# Patient Record
Sex: Male | Born: 1955 | Race: White | Hispanic: No | Marital: Married | Smoking: Never smoker
Health system: Southern US, Community
[De-identification: ages and names within clinical notes are randomized; demographics above are authoritative.]

## PROBLEM LIST (undated history)

## (undated) DIAGNOSIS — I38 Endocarditis, valve unspecified: Secondary | ICD-10-CM

## (undated) DIAGNOSIS — K219 Gastro-esophageal reflux disease without esophagitis: Secondary | ICD-10-CM

## (undated) DIAGNOSIS — J189 Pneumonia, unspecified organism: Secondary | ICD-10-CM

## (undated) DIAGNOSIS — Z9889 Other specified postprocedural states: Secondary | ICD-10-CM

## (undated) DIAGNOSIS — I76 Septic arterial embolism: Secondary | ICD-10-CM

## (undated) DIAGNOSIS — Z862 Personal history of diseases of the blood and blood-forming organs and certain disorders involving the immune mechanism: Secondary | ICD-10-CM

## (undated) DIAGNOSIS — I330 Acute and subacute infective endocarditis: Principal | ICD-10-CM

## (undated) DIAGNOSIS — N189 Chronic kidney disease, unspecified: Secondary | ICD-10-CM

## (undated) DIAGNOSIS — R112 Nausea with vomiting, unspecified: Secondary | ICD-10-CM

## (undated) DIAGNOSIS — R7989 Other specified abnormal findings of blood chemistry: Secondary | ICD-10-CM

## (undated) DIAGNOSIS — E7800 Pure hypercholesterolemia, unspecified: Secondary | ICD-10-CM

## (undated) DIAGNOSIS — I2510 Atherosclerotic heart disease of native coronary artery without angina pectoris: Secondary | ICD-10-CM

## (undated) DIAGNOSIS — K760 Fatty (change of) liver, not elsewhere classified: Secondary | ICD-10-CM

## (undated) HISTORY — PX: PERIPHERALLY INSERTED CENTRAL CATHETER INSERTION: SHX2221

## (undated) HISTORY — DX: Endocarditis, valve unspecified: I38

## (undated) HISTORY — PX: PICC LINE REMOVAL (ARMC HX): HXRAD1261

## (undated) HISTORY — PX: COLONOSCOPY: SHX174

## (undated) HISTORY — DX: Atherosclerotic heart disease of native coronary artery without angina pectoris: I25.10

## (undated) HISTORY — DX: Acute and subacute infective endocarditis: I33.0

## (undated) HISTORY — DX: Fatty (change of) liver, not elsewhere classified: K76.0

## (undated) HISTORY — PX: DENTAL SURGERY: SHX609

## (undated) HISTORY — PX: APPENDECTOMY: SHX54

---

## 2014-02-01 DIAGNOSIS — J189 Pneumonia, unspecified organism: Secondary | ICD-10-CM

## 2014-02-01 HISTORY — DX: Pneumonia, unspecified organism: J18.9

## 2014-05-23 ENCOUNTER — Other Ambulatory Visit (HOSPITAL_COMMUNITY): Payer: Self-pay | Admitting: Orthopedic Surgery

## 2014-05-23 ENCOUNTER — Encounter (HOSPITAL_COMMUNITY): Payer: Self-pay | Admitting: *Deleted

## 2014-05-23 MED ORDER — CEFAZOLIN SODIUM-DEXTROSE 2-3 GM-% IV SOLR
2.0000 g | INTRAVENOUS | Status: DC
  Filled 2014-05-23: qty 50

## 2014-05-23 MED ORDER — MUPIROCIN 2 % EX OINT
1.0000 "application " | TOPICAL_OINTMENT | Freq: Once | CUTANEOUS | Status: AC
  Administered 2014-05-24: 1 via TOPICAL
  Filled 2014-05-23: qty 22

## 2014-05-23 NOTE — Progress Notes (Signed)
Spoke with Stephanie to makJudeth Cornfielde MD aware that pt has an allergy to Penicillin. Judeth CornfieldStephanie to call pharmacy with new order.

## 2014-05-23 NOTE — Progress Notes (Signed)
Pt denies SOB, chest pain, and being under the care of a cardiologist. Pt denies having an EKG and chest x ray within the last year. Pt denies having a stress test, echo and cardiac cath. Pt made aware to stop  taking Aspirin, otc vitamins and herbal medications. Do not take any NSAIDs ie: Ibuprofen, Advil, Naproxen or any medication containing Aspirin. Pt verbalized understanding of all pre-op instructions. 

## 2014-05-24 ENCOUNTER — Observation Stay (HOSPITAL_COMMUNITY)
Admission: RE | Admit: 2014-05-24 | Discharge: 2014-05-25 | Disposition: A | Discharge status: Home / Self Care | Payer: BLUE CROSS/BLUE SHIELD | Source: Ambulatory Visit | Attending: Orthopedic Surgery | Admitting: Orthopedic Surgery

## 2014-05-24 ENCOUNTER — Encounter (HOSPITAL_COMMUNITY)
Admission: RE | Disposition: A | Discharge status: Home / Self Care | Payer: Self-pay | Source: Ambulatory Visit | Attending: Orthopedic Surgery

## 2014-05-24 ENCOUNTER — Ambulatory Visit (HOSPITAL_COMMUNITY): Payer: BLUE CROSS/BLUE SHIELD | Admitting: Certified Registered"

## 2014-05-24 ENCOUNTER — Other Ambulatory Visit: Payer: Self-pay

## 2014-05-24 ENCOUNTER — Ambulatory Visit (HOSPITAL_COMMUNITY): Payer: BLUE CROSS/BLUE SHIELD

## 2014-05-24 ENCOUNTER — Encounter (HOSPITAL_COMMUNITY): Payer: Self-pay | Admitting: *Deleted

## 2014-05-24 DIAGNOSIS — S82853A Displaced trimalleolar fracture of unspecified lower leg, initial encounter for closed fracture: Secondary | ICD-10-CM | POA: Diagnosis present

## 2014-05-24 DIAGNOSIS — Z880 Allergy status to penicillin: Secondary | ICD-10-CM | POA: Insufficient documentation

## 2014-05-24 DIAGNOSIS — S82851A Displaced trimalleolar fracture of right lower leg, initial encounter for closed fracture: Secondary | ICD-10-CM | POA: Diagnosis not present

## 2014-05-24 DIAGNOSIS — Y929 Unspecified place or not applicable: Secondary | ICD-10-CM | POA: Insufficient documentation

## 2014-05-24 DIAGNOSIS — Z884 Allergy status to anesthetic agent status: Secondary | ICD-10-CM | POA: Insufficient documentation

## 2014-05-24 DIAGNOSIS — W1830XA Fall on same level, unspecified, initial encounter: Secondary | ICD-10-CM | POA: Insufficient documentation

## 2014-05-24 DIAGNOSIS — E780 Pure hypercholesterolemia: Secondary | ICD-10-CM | POA: Insufficient documentation

## 2014-05-24 DIAGNOSIS — S82841A Displaced bimalleolar fracture of right lower leg, initial encounter for closed fracture: Secondary | ICD-10-CM

## 2014-05-24 HISTORY — DX: Other specified postprocedural states: Z98.890

## 2014-05-24 HISTORY — PX: ORIF ANKLE FRACTURE: SHX5408

## 2014-05-24 HISTORY — DX: Pure hypercholesterolemia, unspecified: E78.00

## 2014-05-24 HISTORY — DX: Nausea with vomiting, unspecified: R11.2

## 2014-05-24 HISTORY — DX: Other specified abnormal findings of blood chemistry: R79.89

## 2014-05-24 LAB — COMPREHENSIVE METABOLIC PANEL WITH GFR
ALT: 20 U/L (ref 0–53)
AST: 21 U/L (ref 0–37)
Albumin: 3.7 g/dL (ref 3.5–5.2)
Alkaline Phosphatase: 42 U/L (ref 39–117)
Anion gap: 12 (ref 5–15)
BUN: 14 mg/dL (ref 6–23)
CO2: 22 mmol/L (ref 19–32)
Calcium: 9 mg/dL (ref 8.4–10.5)
Chloride: 106 mmol/L (ref 96–112)
Creatinine, Ser: 1.53 mg/dL — ABNORMAL HIGH (ref 0.50–1.35)
GFR calc Af Amer: 56 mL/min — ABNORMAL LOW (ref >90)
GFR calc non Af Amer: 48 mL/min — ABNORMAL LOW (ref >90)
Glucose, Bld: 90 mg/dL (ref 70–99)
Potassium: 4.2 mmol/L (ref 3.5–5.1)
Sodium: 140 mmol/L (ref 135–145)
Total Bilirubin: 1 mg/dL (ref 0.3–1.2)
Total Protein: 7.4 g/dL (ref 6.0–8.3)

## 2014-05-24 LAB — PROTIME-INR
INR: 1.08 (ref 0.00–1.49)
Prothrombin Time: 14.2 seconds (ref 11.6–15.2)

## 2014-05-24 LAB — APTT: aPTT: 30 s (ref 24–37)

## 2014-05-24 LAB — SURGICAL PCR SCREEN
MRSA, PCR: NEGATIVE
Staphylococcus aureus: NEGATIVE

## 2014-05-24 SURGERY — OPEN REDUCTION INTERNAL FIXATION (ORIF) ANKLE FRACTURE
Anesthesia: General | Site: Ankle | Laterality: Right

## 2014-05-24 MED ORDER — ACETAMINOPHEN 325 MG PO TABS
650.0000 mg | ORAL_TABLET | Freq: Four times a day (QID) | ORAL | Status: DC | PRN
Start: 2014-05-24 — End: 2014-05-25

## 2014-05-24 MED ORDER — KETOROLAC TROMETHAMINE 30 MG/ML IJ SOLN
INTRAMUSCULAR | Status: AC
  Filled 2014-05-24: qty 1

## 2014-05-24 MED ORDER — MIDAZOLAM HCL 5 MG/ML IJ SOLN: 2.0000 mg | Freq: Once | INTRAMUSCULAR | Status: DC

## 2014-05-24 MED ORDER — ASPIRIN EC 325 MG PO TBEC
325.0000 mg | DELAYED_RELEASE_TABLET | Freq: Every day | ORAL | Status: DC
  Administered 2014-05-24 – 2014-05-25 (×2): 325 mg via ORAL
  Filled 2014-05-24 (×2): qty 1

## 2014-05-24 MED ORDER — METHOCARBAMOL 500 MG PO TABS
500.0000 mg | ORAL_TABLET | Freq: Four times a day (QID) | ORAL | Status: DC | PRN
  Administered 2014-05-24: 500 mg via ORAL
  Filled 2014-05-24: qty 1

## 2014-05-24 MED ORDER — DEXAMETHASONE SODIUM PHOSPHATE 10 MG/ML IJ SOLN
INTRAMUSCULAR | Status: DC | PRN
  Administered 2014-05-24: 10 mg via INTRAVENOUS

## 2014-05-24 MED ORDER — ASPIRIN EC 325 MG PO TBEC: 325.0000 mg | DELAYED_RELEASE_TABLET | Freq: Every day | ORAL | Status: DC

## 2014-05-24 MED ORDER — OXYCODONE HCL 5 MG PO TABS
5.0000 mg | ORAL_TABLET | ORAL | Status: DC | PRN
  Administered 2014-05-24: 10 mg via ORAL
  Administered 2014-05-25: 5 mg via ORAL
  Administered 2014-05-25: 10 mg via ORAL
  Filled 2014-05-24: qty 1
  Filled 2014-05-24 (×2): qty 2

## 2014-05-24 MED ORDER — KETOROLAC TROMETHAMINE 30 MG/ML IJ SOLN
30.0000 mg | Freq: Once | INTRAMUSCULAR | Status: AC | PRN
  Administered 2014-05-24: 30 mg via INTRAVENOUS

## 2014-05-24 MED ORDER — HYDROMORPHONE HCL 1 MG/ML IJ SOLN
1.0000 mg | INTRAMUSCULAR | Status: DC | PRN
  Filled 2014-05-24: qty 1

## 2014-05-24 MED ORDER — LIDOCAINE HCL (CARDIAC) 20 MG/ML IV SOLN
INTRAVENOUS | Status: AC
  Filled 2014-05-24: qty 5

## 2014-05-24 MED ORDER — HYDROMORPHONE HCL 1 MG/ML IJ SOLN
INTRAMUSCULAR | Status: AC
  Filled 2014-05-24: qty 1

## 2014-05-24 MED ORDER — FENTANYL CITRATE (PF) 100 MCG/2ML IJ SOLN
INTRAMUSCULAR | Status: DC | PRN
  Administered 2014-05-24: 50 ug via INTRAVENOUS

## 2014-05-24 MED ORDER — METHOCARBAMOL 1000 MG/10ML IJ SOLN
500.0000 mg | Freq: Four times a day (QID) | INTRAVENOUS | Status: DC | PRN
  Filled 2014-05-24: qty 5

## 2014-05-24 MED ORDER — CLINDAMYCIN PHOSPHATE 900 MG/50ML IV SOLN
900.0000 mg | Freq: Once | INTRAVENOUS | Status: AC
  Administered 2014-05-24: 900 mg via INTRAVENOUS

## 2014-05-24 MED ORDER — LACTATED RINGERS IV SOLN
INTRAVENOUS | Status: DC
  Administered 2014-05-24: 11:00:00 via INTRAVENOUS

## 2014-05-24 MED ORDER — ONDANSETRON HCL 4 MG/2ML IJ SOLN
INTRAMUSCULAR | Status: AC
  Filled 2014-05-24: qty 2

## 2014-05-24 MED ORDER — OXYCODONE HCL 5 MG/5ML PO SOLN: 5.0000 mg | Freq: Once | ORAL | Status: DC | PRN

## 2014-05-24 MED ORDER — DEXAMETHASONE SODIUM PHOSPHATE 10 MG/ML IJ SOLN
INTRAMUSCULAR | Status: AC
  Filled 2014-05-24: qty 1

## 2014-05-24 MED ORDER — MIDAZOLAM HCL 2 MG/2ML IJ SOLN
INTRAMUSCULAR | Status: AC
  Filled 2014-05-24: qty 2

## 2014-05-24 MED ORDER — ONDANSETRON HCL 4 MG PO TABS: 4.0000 mg | ORAL_TABLET | Freq: Four times a day (QID) | ORAL | Status: DC | PRN

## 2014-05-24 MED ORDER — FENTANYL CITRATE (PF) 250 MCG/5ML IJ SOLN
INTRAMUSCULAR | Status: AC
  Filled 2014-05-24: qty 5

## 2014-05-24 MED ORDER — METOCLOPRAMIDE HCL 5 MG/ML IJ SOLN: 5.0000 mg | Freq: Three times a day (TID) | INTRAMUSCULAR | Status: DC | PRN

## 2014-05-24 MED ORDER — HYDROMORPHONE HCL 1 MG/ML IJ SOLN
0.2500 mg | INTRAMUSCULAR | Status: DC | PRN
  Administered 2014-05-24 (×2): 0.5 mg via INTRAVENOUS

## 2014-05-24 MED ORDER — MIDAZOLAM HCL 5 MG/5ML IJ SOLN
INTRAMUSCULAR | Status: DC | PRN
  Administered 2014-05-24: 2 mg via INTRAVENOUS

## 2014-05-24 MED ORDER — OXYCODONE HCL 5 MG PO TABS: 5.0000 mg | ORAL_TABLET | Freq: Once | ORAL | Status: DC | PRN

## 2014-05-24 MED ORDER — PROPOFOL 10 MG/ML IV BOLUS
INTRAVENOUS | Status: DC | PRN
  Administered 2014-05-24: 160 mg via INTRAVENOUS

## 2014-05-24 MED ORDER — METOCLOPRAMIDE HCL 5 MG PO TABS: 5.0000 mg | ORAL_TABLET | Freq: Three times a day (TID) | ORAL | Status: DC | PRN

## 2014-05-24 MED ORDER — FENTANYL CITRATE (PF) 100 MCG/2ML IJ SOLN
INTRAMUSCULAR | Status: AC
  Filled 2014-05-24: qty 2

## 2014-05-24 MED ORDER — CLINDAMYCIN PHOSPHATE 900 MG/50ML IV SOLN
INTRAVENOUS | Status: AC
  Filled 2014-05-24: qty 50

## 2014-05-24 MED ORDER — FENTANYL CITRATE (PF) 100 MCG/2ML IJ SOLN: 100.0000 ug | Freq: Once | INTRAMUSCULAR | Status: DC

## 2014-05-24 MED ORDER — PROPOFOL 10 MG/ML IV BOLUS
INTRAVENOUS | Status: AC
  Filled 2014-05-24: qty 20

## 2014-05-24 MED ORDER — ACETAMINOPHEN 650 MG RE SUPP: 650.0000 mg | Freq: Four times a day (QID) | RECTAL | Status: DC | PRN

## 2014-05-24 MED ORDER — CLINDAMYCIN PHOSPHATE 600 MG/50ML IV SOLN
600.0000 mg | Freq: Four times a day (QID) | INTRAVENOUS | Status: AC
  Administered 2014-05-24 – 2014-05-25 (×3): 600 mg via INTRAVENOUS
  Filled 2014-05-24 (×3): qty 50

## 2014-05-24 MED ORDER — ONDANSETRON HCL 4 MG/2ML IJ SOLN
INTRAMUSCULAR | Status: DC | PRN
Start: 2014-05-24 — End: 2014-05-24
  Administered 2014-05-24: 4 mg via INTRAVENOUS

## 2014-05-24 MED ORDER — LIDOCAINE HCL (CARDIAC) 20 MG/ML IV SOLN
INTRAVENOUS | Status: DC | PRN
  Administered 2014-05-24: 60 mg via INTRAVENOUS

## 2014-05-24 MED ORDER — SODIUM CHLORIDE 0.9 % IV SOLN
INTRAVENOUS | Status: DC
  Administered 2014-05-24: 17:00:00 via INTRAVENOUS

## 2014-05-24 MED ORDER — ONDANSETRON HCL 4 MG/2ML IJ SOLN: 4.0000 mg | Freq: Once | INTRAMUSCULAR | Status: DC | PRN

## 2014-05-24 MED ORDER — ONDANSETRON HCL 4 MG/2ML IJ SOLN
4.0000 mg | Freq: Four times a day (QID) | INTRAMUSCULAR | Status: DC | PRN
  Administered 2014-05-24: 4 mg via INTRAVENOUS
  Filled 2014-05-24: qty 2

## 2014-05-24 MED ORDER — 0.9 % SODIUM CHLORIDE (POUR BTL) OPTIME
TOPICAL | Status: DC | PRN
  Administered 2014-05-24: 1000 mL

## 2014-05-24 SURGICAL SUPPLY — 59 items
BANDAGE ESMARK 6X9 LF (GAUZE/BANDAGES/DRESSINGS) IMPLANT
BIT DRILL 2.5X110 QC LCP DISP (BIT) ×2 IMPLANT
BIT DRILL CANN 2.7X625 NONSTRL (BIT) ×2 IMPLANT
BNDG COHESIVE 4X5 TAN STRL (GAUZE/BANDAGES/DRESSINGS) ×2 IMPLANT
BNDG ESMARK 6X9 LF (GAUZE/BANDAGES/DRESSINGS)
BNDG GAUZE ELAST 4 BULKY (GAUZE/BANDAGES/DRESSINGS) ×2 IMPLANT
COVER SURGICAL LIGHT HANDLE (MISCELLANEOUS) ×2 IMPLANT
CUFF TOURNIQUET SINGLE 34IN LL (TOURNIQUET CUFF) IMPLANT
CUFF TOURNIQUET SINGLE 44IN (TOURNIQUET CUFF) IMPLANT
DRAPE INCISE IOBAN 66X45 STRL (DRAPES) ×2 IMPLANT
DRAPE OEC MINIVIEW 54X84 (DRAPES) ×2 IMPLANT
DRAPE PROXIMA HALF (DRAPES) ×2 IMPLANT
DRAPE U-SHAPE 47X51 STRL (DRAPES) ×2 IMPLANT
DRSG ADAPTIC 3X8 NADH LF (GAUZE/BANDAGES/DRESSINGS) ×2 IMPLANT
DRSG PAD ABDOMINAL 8X10 ST (GAUZE/BANDAGES/DRESSINGS) IMPLANT
DURAPREP 26ML APPLICATOR (WOUND CARE) ×2 IMPLANT
ELECT REM PT RETURN 9FT ADLT (ELECTROSURGICAL) ×2
ELECTRODE REM PT RTRN 9FT ADLT (ELECTROSURGICAL) ×1 IMPLANT
GAUZE SPONGE 4X4 12PLY STRL (GAUZE/BANDAGES/DRESSINGS) ×2 IMPLANT
GLOVE BIOGEL PI IND STRL 6.5 (GLOVE) ×1 IMPLANT
GLOVE BIOGEL PI IND STRL 9 (GLOVE) ×1 IMPLANT
GLOVE BIOGEL PI INDICATOR 6.5 (GLOVE) ×1
GLOVE BIOGEL PI INDICATOR 9 (GLOVE) ×1
GLOVE ECLIPSE 7.5 STRL STRAW (GLOVE) ×2 IMPLANT
GLOVE SURG ORTHO 9.0 STRL STRW (GLOVE) ×2 IMPLANT
GOWN STRL REUS W/ TWL LRG LVL3 (GOWN DISPOSABLE) ×2 IMPLANT
GOWN STRL REUS W/ TWL XL LVL3 (GOWN DISPOSABLE) ×1 IMPLANT
GOWN STRL REUS W/TWL LRG LVL3 (GOWN DISPOSABLE) ×2
GOWN STRL REUS W/TWL XL LVL3 (GOWN DISPOSABLE) ×1
GUIDEWIRE THREADED 150MM (WIRE) ×4 IMPLANT
KIT BASIN OR (CUSTOM PROCEDURE TRAY) ×2 IMPLANT
KIT ROOM TURNOVER OR (KITS) ×2 IMPLANT
MANIFOLD NEPTUNE II (INSTRUMENTS) IMPLANT
NS IRRIG 1000ML POUR BTL (IV SOLUTION) ×2 IMPLANT
PACK ORTHO EXTREMITY (CUSTOM PROCEDURE TRAY) ×2 IMPLANT
PAD ABD 8X10 STRL (GAUZE/BANDAGES/DRESSINGS) ×2 IMPLANT
PAD ARMBOARD 7.5X6 YLW CONV (MISCELLANEOUS) ×4 IMPLANT
PLATE LCP 3.5 1/3 TUB 7HX81 (Plate) ×2 IMPLANT
SCREW CANN S THRD/44 4.0 (Screw) ×4 IMPLANT
SCREW CORTEX 3.5 12MM (Screw) ×2 IMPLANT
SCREW CORTEX 3.5 14MM (Screw) ×1 IMPLANT
SCREW CORTEX 3.5 18MM (Screw) ×1 IMPLANT
SCREW CORTEX 3.5 20MM (Screw) ×1 IMPLANT
SCREW CORTEX 3.5 24MM (Screw) ×1 IMPLANT
SCREW LOCK CORT ST 3.5X12 (Screw) ×2 IMPLANT
SCREW LOCK CORT ST 3.5X14 (Screw) ×1 IMPLANT
SCREW LOCK CORT ST 3.5X18 (Screw) ×1 IMPLANT
SCREW LOCK CORT ST 3.5X20 (Screw) ×1 IMPLANT
SCREW LOCK CORT ST 3.5X24 (Screw) ×1 IMPLANT
SCREW LOCK T15 FT 16X3.5X2.9X (Screw) ×1 IMPLANT
SCREW LOCKING 3.5X16 (Screw) ×1 IMPLANT
SPONGE LAP 18X18 X RAY DECT (DISPOSABLE) ×4 IMPLANT
STAPLER VISISTAT 35W (STAPLE) ×2 IMPLANT
SUCTION FRAZIER TIP 10 FR DISP (SUCTIONS) ×2 IMPLANT
SUT ETHILON 2 0 PSLX (SUTURE) IMPLANT
SUT VIC AB 2-0 CTB1 (SUTURE) ×4 IMPLANT
TOWEL OR 17X24 6PK STRL BLUE (TOWEL DISPOSABLE) ×2 IMPLANT
TOWEL OR 17X26 10 PK STRL BLUE (TOWEL DISPOSABLE) ×2 IMPLANT
TUBE CONNECTING 12X1/4 (SUCTIONS) ×2 IMPLANT

## 2014-05-24 NOTE — Anesthesia Preprocedure Evaluation (Signed)
Anesthesia Evaluation  Patient identified by MRN, date of birth, ID band Patient awake    Reviewed: Allergy & Precautions, NPO status , Patient's Chart, lab work & pertinent test results  Airway Mallampati: II  TM Distance: >3 FB Neck ROM: Full    Dental  (+) Teeth Intact, Dental Advisory Given   Pulmonary  breath sounds clear to auscultation        Cardiovascular Rhythm:Regular Rate:Normal     Neuro/Psych    GI/Hepatic   Endo/Other    Renal/GU      Musculoskeletal   Abdominal   Peds  Hematology   Anesthesia Other Findings   Reproductive/Obstetrics                             Anesthesia Physical Anesthesia Plan  ASA: III  Anesthesia Plan: General   Post-op Pain Management:    Induction: Intravenous  Airway Management Planned: LMA  Additional Equipment:   Intra-op Plan:   Post-operative Plan:   Informed Consent: I have reviewed the patients History and Physical, chart, labs and discussed the procedure including the risks, benefits and alternatives for the proposed anesthesia with the patient or authorized representative who has indicated his/her understanding and acceptance.   Dental advisory given  Plan Discussed with: CRNA and Anesthesiologist  Anesthesia Plan Comments: (Fracture R. Ankle  Plan GA with LMA and popliteal block  Kipp Broodavid Monty Mccarrell)        Anesthesia Quick Evaluation

## 2014-05-24 NOTE — Transfer of Care (Signed)
Immediate Anesthesia Transfer of Care Note  Patient: Connor Frazier  Procedure(s) Performed: Procedure(s): OPEN REDUCTION INTERNAL FIXATION (ORIF) ANKLE FRACTURE (Right)  Patient Location: PACU  Anesthesia Type:GA combined with regional for post-op pain  Level of Consciousness: awake, alert  and oriented  Airway & Oxygen Therapy: Patient Spontanous Breathing and Patient connected to nasal cannula oxygen  Post-op Assessment: Report given to RN, Post -op Vital signs reviewed and stable and Patient moving all extremities  Post vital signs: Reviewed and stable  Last Vitals:  Filed Vitals:   05/24/14 1030  BP: 163/77  Pulse: 71  Temp: 36.6 C  Resp: 18    Complications: No apparent anesthesia complications

## 2014-05-24 NOTE — Anesthesia Procedure Notes (Addendum)
Procedure Name: LMA Insertion Date/Time: 05/24/2014 12:49 PM Performed by: Charm BargesBUTLER, Carmyn Hamm R Pre-anesthesia Checklist: Patient identified, Emergency Drugs available, Suction available, Patient being monitored and Timeout performed Patient Re-evaluated:Patient Re-evaluated prior to inductionOxygen Delivery Method: Circle system utilized Preoxygenation: Pre-oxygenation with 100% oxygen Intubation Type: IV induction Ventilation: Mask ventilation without difficulty LMA: LMA inserted LMA Size: 5.0 Number of attempts: 1 Placement Confirmation: positive ETCO2 Tube secured with: Tape Dental Injury: Teeth and Oropharynx as per pre-operative assessment    Anesthesia Regional Block:  Popliteal block  Pre-Anesthetic Checklist: ,, timeout performed, Correct Patient, Correct Site, Correct Laterality, Correct Procedure, Correct Position, site marked, Risks and benefits discussed,  Surgical consent,  Pre-op evaluation,  At surgeon's request and post-op pain management  Laterality: Right  Prep: chloraprep       Needles:   Needle Type: Echogenic Stimulator Needle     Needle Length: 9cm 9 cm Needle Gauge: 22 and 22 G    Additional Needles:  Procedures: ultrasound guided (picture in chart) Popliteal block Narrative:  Start time: 05/24/2014 2:20 PM End time: 05/24/2014 2:25 PM Injection made incrementally with aspirations every 5 mL.  Additional Notes: 30 cc 0.5% bupivacaine with 1:200 Epi injected easily

## 2014-05-24 NOTE — Anesthesia Postprocedure Evaluation (Signed)
  Anesthesia Post-op Note  Patient: Connor Frazier  Procedure(s) Performed: Procedure(s): OPEN REDUCTION INTERNAL FIXATION (ORIF) ANKLE FRACTURE (Right)  Patient Location: PACU  Anesthesia Type:General and GA combined with regional for post-op pain  Level of Consciousness: awake, alert  and oriented  Airway and Oxygen Therapy: Patient Spontanous Breathing and Patient connected to nasal cannula oxygen  Post-op Pain: none  Post-op Assessment: Post-op Vital signs reviewed, Patient's Cardiovascular Status Stable, Respiratory Function Stable and Pain level controlled  Post-op Vital Signs: stable  Last Vitals:  Filed Vitals:   05/24/14 1523  BP: 122/69  Pulse: 72  Temp: 36.6 C  Resp: 16    Complications: No apparent anesthesia complications

## 2014-05-24 NOTE — Evaluation (Signed)
Physical Therapy Evaluation Patient Details Name: Connor LeaderSebastian Frazier MRN: [id number] DOB: Feb 24, 1955 Today's Date: 05/24/2014   History of Present Illness  Pt is a 59 y/o M s/p R trimalleolar ankle fx and ORIF.  Pt's PMH includes PONV.  Clinical Impression  Patient is s/p above surgery resulting in functional limitations due to the deficits listed below (see PT Problem List).  Pt demonstrated ability to safely transfer RW<>knee walker and use of knee walker in hallway.  Pt is anticipating d/c tomorrow and will need assist donning CAM boot.  Unable to apply this session 2/2 pt's decreased mobility at ankle and nerve block, pt unable to achieve adequate DF.  Patient will benefit from skilled PT to increase their independence and safety with mobility to allow discharge to the venue listed below.       Follow Up Recommendations Outpatient PT;Supervision - Intermittent    Equipment Recommendations  Rolling walker with 5" wheels;3in1 (PT)    Recommendations for Other Services       Precautions / Restrictions Precautions Precautions: Fall Precaution Comments: Reviewed w/ pt the importance of keeping RLE elevated Required Braces or Orthoses: Other Brace/Splint Other Brace/Splint: CAM RLE Restrictions Weight Bearing Restrictions: Yes RLE Weight Bearing: Non weight bearing      Mobility  Bed Mobility Overal bed mobility: Modified Independent             General bed mobility comments: increased time, min use of bed rails  Transfers Overall transfer level: Needs assistance Equipment used: Rolling walker (2 wheeled) Transfers: Sit to/from UGI CorporationStand;Stand Pivot Transfers Sit to Stand: Min guard;Supervision Stand pivot transfers: Min guard (RW<>knee scooter)       General transfer comment: cues for hand placement and to push from bed. Cues and demonstration for proper transfer to knee walker.  Ambulation/Gait Ambulation/Gait assistance: Min guard;Supervision Ambulation Distance  (Feet): 250 Feet (10 w/ RW, 140 w/ knee walker) Assistive device: Rolling walker (2 wheeled) (knee walker) Gait Pattern/deviations: Step-to pattern;Antalgic (hop on LLE)   Gait velocity interpretation: Below normal speed for age/gender General Gait Details: Verbal cues to take turns slowly w/ knee walker.  Pt demonstrated ability to safely perform 3 point turn, turns to L and R, using brakes, and reversing w/ knee walker.  Stairs            Wheelchair Mobility    Modified Rankin (Stroke Patients Only)       Balance Overall balance assessment: Needs assistance Sitting-balance support: No upper extremity supported;Feet supported Sitting balance-Leahy Scale: Good     Standing balance support: Bilateral upper extremity supported;During functional activity Standing balance-Leahy Scale: Fair                               Pertinent Vitals/Pain Pain Assessment: 0-10 Pain Score: 0-No pain    Home Living Family/patient expects to be discharged to:: Private residence Living Arrangements: Spouse/significant other Available Help at Discharge: Family;Available PRN/intermittently (Wife works) Type of Home: House Home Access: Level entry     Home Layout: One level Home Equipment: Crutches Additional Comments: Pt has been using crutches but says he feels unsteady with them    Prior Function Level of Independence: Independent with assistive device(s);Independent ((I) before injury, (I) w/ crutches after injury)               Hand Dominance   Dominant Hand: Right    Extremity/Trunk Assessment  Lower Extremity Assessment: RLE deficits/detail RLE Deficits / Details: weakness and limited ROM as expected s/p R ankle ORIF    Cervical / Trunk Assessment: Normal  Communication   Communication: No difficulties  Cognition Arousal/Alertness: Awake/alert Behavior During Therapy: WFL for tasks assessed/performed Overall Cognitive Status: Within  Functional Limits for tasks assessed                      General Comments General comments (skin integrity, edema, etc.): Provided pt w/ knee scooter handout.  Pt and pt's wife will discuss financial aspect of knee walker and will likely order knee walker once pt is ready to return to work.  Pt says he is walking and moving a lot at work and will fatigue too easily using RW.    Exercises Total Joint Exercises Ankle Circles/Pumps: AROM;Left;10 reps;Supine Quad Sets: AROM;Both;10 reps;Supine Straight Leg Raises: AROM;Both;10 reps;Supine Long Arc Quad: AROM;Both;10 reps;Seated      Assessment/Plan    PT Assessment Patient needs continued PT services  PT Diagnosis Difficulty walking;Abnormality of gait;Generalized weakness;Acute pain   PT Problem List Decreased strength;Decreased range of motion;Decreased activity tolerance;Decreased balance;Decreased mobility;Decreased coordination;Decreased knowledge of use of DME;Decreased safety awareness;Decreased knowledge of precautions;Impaired sensation;Pain;Decreased skin integrity  PT Treatment Interventions DME instruction;Gait training;Functional mobility training;Therapeutic activities;Therapeutic exercise;Balance training;Neuromuscular re-education;Patient/family education;Modalities   PT Goals (Current goals can be found in the Care Plan section) Acute Rehab PT Goals Patient Stated Goal: to go home tomorrow PT Goal Formulation: With patient/family Time For Goal Achievement: 05/31/14 Potential to Achieve Goals: Good    Frequency Min 5X/week   Barriers to discharge Decreased caregiver support wife available intermittently.  Works full-time    Merchandiser, retail During Treatment: Gait belt Activity Tolerance: Patient tolerated treatment well;No increased pain Patient left: in chair;with call bell/phone within reach;with family/visitor present Nurse Communication: Mobility  status;Precautions;Weight bearing status         Time: 1610-9604 PT Time Calculation (min) (ACUTE ONLY): 35 min   Charges:   PT Evaluation $Initial PT Evaluation Tier I: 1 Procedure PT Treatments $Gait Training: 8-22 mins   PT G CodesMichail Jewels PT, DPT [phone] Pager: [phone] 05/24/2014, 5:11 PM

## 2014-05-24 NOTE — Progress Notes (Signed)
Orthopedic Tech Progress Note Patient Details:  Connor LeaderSebastian Frazier 1955/11/12 [id number]  Ortho Devices Ortho Device/Splint Location: rle Ortho Device/Splint Interventions: Application Cam walker  Nikki Domrawford, Tameem Pullara 05/24/2014, 2:23 PM

## 2014-05-24 NOTE — H&P (Signed)
Connor Frazier is an 59 y.o. male.   Chief Complaint: Fracture dislocation trimalleolar right ankle HPI: Patient is a 59 year old gentleman who sustained a ground-level fall sustaining fracture dislocation of the right ankle.  Past Medical History  Diagnosis Date  . PONV (postoperative nausea and vomiting)   . Elevated serum creatinine   . Hypercholesterolemia     Past Surgical History  Procedure Laterality Date  . Appendectomy    . Colonoscopy    . Dental surgery      implants     Family History  Problem Relation Age of Onset  . Dementia Mother   . Diabetes Father   . Heart disease Father   . Cancer - Prostate Father    Social History:  reports that he has never smoked. He has never used smokeless tobacco. He reports that he drinks alcohol. He reports that he does not use illicit drugs.  Allergies:  Allergies  Allergen Reactions  . Other     ANAESTHESIA MAKES PT VERY SICK ON STOMACH  . Penicillins Other (See Comments)    Upset stomach    No prescriptions prior to admission    No results found for this or any previous visit (from the past 48 hour(s)). No results found.  Review of Systems  All other systems reviewed and are negative.   There were no vitals taken for this visit. Physical Exam  On examination patient has a palpable pulse. Radiographs shows persistent subluxation of the trimalleolar ankle fracture. Assessment/Plan Assessment: Trimalleolar right ankle fracture dislocation.  Plan: We'll plan for open reduction internal fixation. Risks and benefits were discussed including infection neurovascular injury pain DVT need for additional surgery. Patient states he understands and wishes to proceed at this time.  Pixie Burgener V 05/24/2014, 6:32 AM

## 2014-05-24 NOTE — Op Note (Signed)
05/24/2014  1:34 PM  PATIENT:  Connor Frazier    PRE-OPERATIVE DIAGNOSIS:  Trimalleolar Right Ankle Fracture  POST-OPERATIVE DIAGNOSIS:  Same  PROCEDURE:  OPEN REDUCTION INTERNAL FIXATION (ORIF) ANKLE FRACTURE  SURGEON:  Nadara MustardUDA,MARCUS V, MD  PHYSICIAN ASSISTANT:None ANESTHESIA:   General  PREOPERATIVE INDICATIONS:  [name] is a  59 y.o. male with a diagnosis of Bimalleolar Right Ankle Fracture who failed conservative measures and elected for surgical management.    The risks benefits and alternatives were discussed with the patient preoperatively including but not limited to the risks of infection, bleeding, nerve injury, cardiopulmonary complications, the need for revision surgery, among others, and the patient was willing to proceed.  OPERATIVE IMPLANTS: 7-hole one third tubular locking plate laterally with 2 compression screws medially  OPERATIVE FINDINGS: Segmental fragments to the distal fibula secured with interfrag screws.  OPERATIVE PROCEDURE: Patient is a 59 year old gentleman status post trimalleolar ankle fracture with comminution of the fibula who presents at this time for open reduction internal fixation.  Patient was brought to the operating room after undergoing a popliteal block. After adequate levels anesthesia obtained patient underwent general anesthetic and the right lower extremity was prepped using DuraPrep draped into a sterile field Ioban was used to cover all exposed skin. A timeout was called. A lateral incision was made carried down to the fibula. There is approximately a 1 cm x 2 cm segmental fragment of the distal fibula. The wound was irrigated with normal saline bony fragments were removed from the joint. The fracture fragments were reduced and the proximal distal flap fragment was stabilized with a interfrag screw and the segmental fragment was also secured with interfrag screw. A neutralization plate was placed laterally and this was  stabilized with a locking and compression screws distally and compression screws proximally. C-arm floss be verified restoration of the length and the alignment of the fibula. Attention was then focused on the medial malleolus. The minimal a fragment was debrided and reduced. Bony fragments from from within the joint were removed. This was stabilized with 2 K wires and then secured with 2-44 mm 4.0 cannulated screws. C-arm fluoroscopy verified restoration of the mortise. The wounds were irrigated normal saline. Subcutaneous is closed using 2-0 Vicryl for both incisions. The skin was closed using staples. Compression dressing was applied. Patient was extubated taken to the PACU in stable condition plan for discharge to home.

## 2014-05-25 DIAGNOSIS — S82851A Displaced trimalleolar fracture of right lower leg, initial encounter for closed fracture: Secondary | ICD-10-CM | POA: Diagnosis not present

## 2014-05-25 MED ORDER — CHLORPROMAZINE HCL 25 MG PO TABS
25.0000 mg | ORAL_TABLET | Freq: Once | ORAL | Status: AC
Start: 2014-05-25 — End: 2014-05-25
  Administered 2014-05-25: 25 mg via ORAL
  Filled 2014-05-25: qty 1

## 2014-05-25 NOTE — Progress Notes (Signed)
Pt stable No pain right ankle Dressing not to tight Motor sensory right foot still under block Plan dc today - needs pain med before dc

## 2014-05-25 NOTE — Care Management Note (Signed)
CARE MANAGEMENT NOTE 05/25/2014  Patient:  Connor Frazier, Connor Frazier   Account Number:  [id number]  Date Initiated:  05/25/2014  Documentation initiated by:  Oliveras-Aizpurua,Delayza Lungren   Subjective/Objective Assessment:   59 yo M sustained a ground level fall. s/p R Trimalleolar ankle fx and ORIF.     Action/Plan:   PT is recommending outpt PT, RW and a 3  in 1 BSC.   Anticipated DC Date:  05/25/2014   Anticipated DC Plan:  [facility]  CM consult      PAC Choice  [place]   Choice offered to / List presented to:  C-1 Patient   DME arranged  3-N-1  Vassie Moselle      DME agency  [place] arranged  [place].   Status of service:  Completed, signed off Medicare Important Message given?   (If response is "NO", the following Medicare IM given date fields will be blank) Date Medicare IM given:   Medicare IM given by:   Date Additional Medicare IM given:   Additional Medicare IM given by:    Discharge Disposition:    Per UR Regulation:    If discussed at Long Length of Stay Meetings, dates discussed:    Comments:  05/25/14   11:00   Frann Rider, RN, BSN  Met with pt to discuss d/c plan. He plans to return home with the support of his wife. Discussed outpt rehab. He stated that he can't walk and he is not able to drive. Discussed HHPT and he agreed. Contacted Dr. Marlou Sa and order received for HHPT. Provided pt with a list of Walker agencies. He wants to use [facility] for HHPT and DME. He needs a RW and a 3-1 - BSC. Contacted Cyril Mourning and Jeneen Rinks for referrals.

## 2014-05-25 NOTE — Progress Notes (Signed)
D/C instructions reviewed with patient and his wife. Rx had previously been called into pharmacy. IV saline lock removed/ cannula intact. Pain medication provided. Sensation slowly returning to his Rt foot - feels pressure on toes. Wrap to LLE/ foot intact w/o drainage. Belongings and patient [in a w/c] taken to [address] front exit for d/c with his wife by [employer].

## 2014-05-25 NOTE — Progress Notes (Signed)
Physical Therapy Treatment Patient Details Name: Connor Frazier: [id number] DOB: [birth year] Today's Date: 05/25/2014    History of Present Illness Pt is a 59 y/o M s/p R trimalleolar ankle fx and ORIF.  Pt's PMH includes PONV.    PT Comments    Pt is making progress towards goals and increasing functional independence. Pt stated that he has ordered a knee walker and felt comfortable with using it at home. Pt c/o residual numbness in R foot from nerve block and still having difficulty with sensation in toes. RN made aware. Pt safe to D/C from a mobility standpoint based on progression towards goals set on PT eval.  Follow Up Recommendations  Outpatient PT;Supervision - Intermittent     Equipment Recommendations  Rolling walker with 5" wheels;3in1 (PT)    Recommendations for Other Services       Precautions / Restrictions Precautions Precautions: Fall Restrictions Weight Bearing Restrictions: Yes RLE Weight Bearing: Non weight bearing    Mobility  Bed Mobility Overal bed mobility: Modified Independent                Transfers Overall transfer level: Needs assistance Equipment used: Rolling walker (2 wheeled) Transfers: Sit to/from Stand Sit to Stand: Supervision         General transfer comment: Supervision for safety.  Ambulation/Gait Ambulation/Gait assistance: Min guard Ambulation Distance (Feet): 40 Feet Assistive device: Rolling walker (2 wheeled) Gait Pattern/deviations: Step-to pattern   Gait velocity interpretation: Below normal speed for age/gender General Gait Details: Able to ambulate safely with hop-to pattern. Min guard for safety.   Stairs            Wheelchair Mobility    Modified Rankin (Stroke Patients Only)       Balance                                    Cognition Arousal/Alertness: Awake/alert Behavior During Therapy: WFL for tasks assessed/performed Overall Cognitive Status: Within Functional  Limits for tasks assessed                      Exercises      General Comments        Pertinent Vitals/Pain Pain Assessment: 0-10 Pain Score: 1  Pain Location: R ankle Pain Descriptors / Indicators: Numbness;Aching Pain Intervention(s): Monitored during session    Home Living                      Prior Function            PT Goals (current goals can now be found in the care plan section) Progress towards PT goals: Progressing toward goals    Frequency  Min 5X/week    PT Plan Current plan remains appropriate    Co-evaluation             End of Session   Activity Tolerance: Patient tolerated treatment well;No increased pain Patient left: in bed;with call bell/phone within reach;with family/visitor present     Time: 1317-1330 PT Time Calculation (min) (ACUTE ONLY): 13 min  Charges:                       G CodesLeonard Schwartz:      Senetra Dillin, SPTA 05/25/2014, 2:03 PM

## 2014-05-25 NOTE — Progress Notes (Signed)
UR completed 

## 2014-05-27 ENCOUNTER — Encounter (HOSPITAL_COMMUNITY): Payer: Self-pay | Admitting: Orthopedic Surgery

## 2014-05-28 NOTE — Progress Notes (Signed)
Late entry for G-codes.     05/24/14 1711  PT G-Codes **NOT FOR INPATIENT CLASS**  Functional Assessment Tool Used clinical judgement  Functional Limitation Mobility: Walking and moving around  Mobility: Walking and Moving Around Current Status (Z6109(G8978) CJ  Mobility: Walking and Moving Around Goal Status (U0454(G8979) CI   Michail JewelsAshley Parr PT, DPT [phone] Pager: [phone]

## 2014-05-29 ENCOUNTER — Encounter (HOSPITAL_COMMUNITY)
Admission: RE | Disposition: A | Discharge status: Home / Self Care | Payer: Self-pay | Source: Ambulatory Visit | Attending: Orthopedic Surgery

## 2014-05-29 ENCOUNTER — Other Ambulatory Visit (HOSPITAL_COMMUNITY): Payer: Self-pay | Admitting: Orthopedic Surgery

## 2014-05-29 ENCOUNTER — Encounter (HOSPITAL_COMMUNITY): Payer: Self-pay | Admitting: *Deleted

## 2014-05-29 ENCOUNTER — Inpatient Hospital Stay (HOSPITAL_COMMUNITY): Payer: BLUE CROSS/BLUE SHIELD | Admitting: Certified Registered"

## 2014-05-29 ENCOUNTER — Inpatient Hospital Stay (HOSPITAL_COMMUNITY)
Admission: RE | Admit: 2014-05-29 | Discharge: 2014-06-01 | DRG: 857 | Disposition: A | Discharge status: Home / Self Care | Payer: BLUE CROSS/BLUE SHIELD | Source: Ambulatory Visit | Attending: Orthopedic Surgery | Admitting: Orthopedic Surgery

## 2014-05-29 DIAGNOSIS — Z7982 Long term (current) use of aspirin: Secondary | ICD-10-CM

## 2014-05-29 DIAGNOSIS — Y838 Other surgical procedures as the cause of abnormal reaction of the patient, or of later complication, without mention of misadventure at the time of the procedure: Secondary | ICD-10-CM | POA: Diagnosis present

## 2014-05-29 DIAGNOSIS — Z880 Allergy status to penicillin: Secondary | ICD-10-CM

## 2014-05-29 DIAGNOSIS — T847XXA Infection and inflammatory reaction due to other internal orthopedic prosthetic devices, implants and grafts, initial encounter: Secondary | ICD-10-CM | POA: Diagnosis present

## 2014-05-29 DIAGNOSIS — L03115 Cellulitis of right lower limb: Secondary | ICD-10-CM | POA: Diagnosis present

## 2014-05-29 DIAGNOSIS — T814XXA Infection following a procedure, initial encounter: Principal | ICD-10-CM | POA: Diagnosis present

## 2014-05-29 DIAGNOSIS — E780 Pure hypercholesterolemia: Secondary | ICD-10-CM | POA: Diagnosis present

## 2014-05-29 HISTORY — PX: IRRIGATION AND DEBRIDEMENT ABSCESS: SHX5252

## 2014-05-29 HISTORY — PX: I & D EXTREMITY: SHX5045

## 2014-05-29 SURGERY — IRRIGATION AND DEBRIDEMENT EXTREMITY
Anesthesia: General | Laterality: Right

## 2014-05-29 MED ORDER — HYDROMORPHONE HCL 1 MG/ML IJ SOLN
1.0000 mg | INTRAMUSCULAR | Status: DC | PRN
  Administered 2014-05-31: 1 mg via INTRAVENOUS
  Filled 2014-05-29: qty 1

## 2014-05-29 MED ORDER — OXYCODONE HCL 5 MG/5ML PO SOLN: 5.0000 mg | Freq: Once | ORAL | Status: AC | PRN

## 2014-05-29 MED ORDER — PROPOFOL 10 MG/ML IV BOLUS
INTRAVENOUS | Status: DC | PRN
  Administered 2014-05-29: 170 mg via INTRAVENOUS

## 2014-05-29 MED ORDER — LACTATED RINGERS IV SOLN
INTRAVENOUS | Status: DC | PRN
  Administered 2014-05-29: 20:00:00 via INTRAVENOUS

## 2014-05-29 MED ORDER — SODIUM CHLORIDE 0.9 % IV SOLN
INTRAVENOUS | Status: DC
  Administered 2014-05-29: via INTRAVENOUS

## 2014-05-29 MED ORDER — PIPERACILLIN-TAZOBACTAM 3.375 G IVPB
3.3750 g | Freq: Three times a day (TID) | INTRAVENOUS | Status: DC
  Administered 2014-05-30 – 2014-06-01 (×8): 3.375 g via INTRAVENOUS
  Filled 2014-05-29 (×10): qty 50

## 2014-05-29 MED ORDER — ONDANSETRON HCL 4 MG/2ML IJ SOLN: 4.0000 mg | Freq: Four times a day (QID) | INTRAMUSCULAR | Status: DC | PRN

## 2014-05-29 MED ORDER — MIDAZOLAM HCL 2 MG/2ML IJ SOLN
INTRAMUSCULAR | Status: AC
  Filled 2014-05-29: qty 2

## 2014-05-29 MED ORDER — CLINDAMYCIN PHOSPHATE 900 MG/50ML IV SOLN
INTRAVENOUS | Status: AC
  Administered 2014-05-29: 900 mg via INTRAVENOUS
  Filled 2014-05-29: qty 50

## 2014-05-29 MED ORDER — LACTATED RINGERS IV SOLN: INTRAVENOUS | Status: DC

## 2014-05-29 MED ORDER — METOCLOPRAMIDE HCL 5 MG/ML IJ SOLN: 5.0000 mg | Freq: Three times a day (TID) | INTRAMUSCULAR | Status: DC | PRN

## 2014-05-29 MED ORDER — METHOCARBAMOL 500 MG PO TABS
500.0000 mg | ORAL_TABLET | Freq: Four times a day (QID) | ORAL | Status: DC | PRN
  Administered 2014-05-30: 500 mg via ORAL
  Filled 2014-05-29 (×2): qty 1

## 2014-05-29 MED ORDER — ONDANSETRON HCL 4 MG PO TABS: 4.0000 mg | ORAL_TABLET | Freq: Four times a day (QID) | ORAL | Status: DC | PRN

## 2014-05-29 MED ORDER — PROPOFOL 10 MG/ML IV BOLUS
INTRAVENOUS | Status: AC
  Filled 2014-05-29: qty 20

## 2014-05-29 MED ORDER — GENTAMICIN SULFATE 40 MG/ML IJ SOLN
INTRAMUSCULAR | Status: DC | PRN
  Administered 2014-05-29: 240 mg via INTRAMUSCULAR

## 2014-05-29 MED ORDER — ONDANSETRON HCL 4 MG/2ML IJ SOLN
INTRAMUSCULAR | Status: DC | PRN
  Administered 2014-05-29: 4 mg via INTRAVENOUS

## 2014-05-29 MED ORDER — FENTANYL CITRATE (PF) 100 MCG/2ML IJ SOLN
INTRAMUSCULAR | Status: DC | PRN
  Administered 2014-05-29: 50 ug via INTRAVENOUS
  Administered 2014-05-29: 150 ug via INTRAVENOUS
  Administered 2014-05-29: 50 ug via INTRAVENOUS

## 2014-05-29 MED ORDER — ACETAMINOPHEN 650 MG RE SUPP: 650.0000 mg | Freq: Four times a day (QID) | RECTAL | Status: DC | PRN

## 2014-05-29 MED ORDER — METOCLOPRAMIDE HCL 5 MG PO TABS: 5.0000 mg | ORAL_TABLET | Freq: Three times a day (TID) | ORAL | Status: DC | PRN

## 2014-05-29 MED ORDER — ACETAMINOPHEN 325 MG PO TABS: 650.0000 mg | ORAL_TABLET | Freq: Four times a day (QID) | ORAL | Status: DC | PRN

## 2014-05-29 MED ORDER — LIDOCAINE HCL (CARDIAC) 20 MG/ML IV SOLN
INTRAVENOUS | Status: DC | PRN
  Administered 2014-05-29: 75 mg via INTRAVENOUS

## 2014-05-29 MED ORDER — FENTANYL CITRATE (PF) 250 MCG/5ML IJ SOLN
INTRAMUSCULAR | Status: AC
  Filled 2014-05-29: qty 5

## 2014-05-29 MED ORDER — HYDROMORPHONE HCL 1 MG/ML IJ SOLN
INTRAMUSCULAR | Status: AC
  Filled 2014-05-29: qty 1

## 2014-05-29 MED ORDER — CLINDAMYCIN PHOSPHATE 900 MG/50ML IV SOLN: 900.0000 mg | INTRAVENOUS | Status: DC

## 2014-05-29 MED ORDER — VANCOMYCIN HCL IN DEXTROSE 1-5 GM/200ML-% IV SOLN: 1000.0000 mg | Freq: Two times a day (BID) | INTRAVENOUS | Status: DC

## 2014-05-29 MED ORDER — VANCOMYCIN HCL 10 G IV SOLR
1250.0000 mg | Freq: Two times a day (BID) | INTRAVENOUS | Status: DC
  Administered 2014-05-29 – 2014-05-31 (×4): 1250 mg via INTRAVENOUS
  Filled 2014-05-29 (×5): qty 1250

## 2014-05-29 MED ORDER — VANCOMYCIN HCL 1000 MG IV SOLR
INTRAVENOUS | Status: DC | PRN
  Administered 2014-05-29: 1000 mg

## 2014-05-29 MED ORDER — MIDAZOLAM HCL 5 MG/5ML IJ SOLN
INTRAMUSCULAR | Status: DC | PRN
  Administered 2014-05-29: 2 mg via INTRAVENOUS

## 2014-05-29 MED ORDER — VANCOMYCIN HCL 1000 MG IV SOLR
INTRAVENOUS | Status: AC
  Filled 2014-05-29: qty 1000

## 2014-05-29 MED ORDER — OXYCODONE HCL 5 MG PO TABS
5.0000 mg | ORAL_TABLET | Freq: Once | ORAL | Status: AC | PRN
  Administered 2014-05-29: 5 mg via ORAL

## 2014-05-29 MED ORDER — HYDROMORPHONE HCL 1 MG/ML IJ SOLN
0.2500 mg | INTRAMUSCULAR | Status: DC | PRN
  Administered 2014-05-29 (×4): 0.5 mg via INTRAVENOUS

## 2014-05-29 MED ORDER — ONDANSETRON HCL 4 MG/2ML IJ SOLN: 4.0000 mg | Freq: Once | INTRAMUSCULAR | Status: DC | PRN

## 2014-05-29 MED ORDER — OXYCODONE HCL 5 MG PO TABS
5.0000 mg | ORAL_TABLET | ORAL | Status: DC | PRN
  Administered 2014-05-30: 5 mg via ORAL
  Administered 2014-05-30 (×3): 10 mg via ORAL
  Administered 2014-05-31 (×4): 5 mg via ORAL
  Administered 2014-05-31 – 2014-06-01 (×2): 10 mg via ORAL
  Filled 2014-05-29: qty 1
  Filled 2014-05-29 (×6): qty 2
  Filled 2014-05-29 (×2): qty 1
  Filled 2014-05-29 (×2): qty 2

## 2014-05-29 MED ORDER — 0.9 % SODIUM CHLORIDE (POUR BTL) OPTIME
TOPICAL | Status: DC | PRN
  Administered 2014-05-29: 1000 mL

## 2014-05-29 MED ORDER — ASPIRIN EC 325 MG PO TBEC
325.0000 mg | DELAYED_RELEASE_TABLET | Freq: Every day | ORAL | Status: DC
  Administered 2014-05-30 – 2014-06-01 (×3): 325 mg via ORAL
  Filled 2014-05-29 (×3): qty 1

## 2014-05-29 MED ORDER — METHOCARBAMOL 1000 MG/10ML IJ SOLN
500.0000 mg | Freq: Four times a day (QID) | INTRAVENOUS | Status: DC | PRN
  Administered 2014-05-29: 500 mg via INTRAVENOUS
  Filled 2014-05-29 (×2): qty 5

## 2014-05-29 MED ORDER — OXYCODONE HCL 5 MG PO TABS
ORAL_TABLET | ORAL | Status: AC
  Filled 2014-05-29: qty 1

## 2014-05-29 MED ORDER — LIDOCAINE HCL (CARDIAC) 20 MG/ML IV SOLN
INTRAVENOUS | Status: AC
  Filled 2014-05-29: qty 10

## 2014-05-29 MED ORDER — GENTAMICIN SULFATE 40 MG/ML IJ SOLN
INTRAMUSCULAR | Status: AC
  Filled 2014-05-29: qty 6

## 2014-05-29 SURGICAL SUPPLY — 43 items
BLADE SURG 10 STRL SS (BLADE) IMPLANT
BLADE SURG 21 STRL SS (BLADE) ×3 IMPLANT
BNDG COHESIVE 4X5 TAN STRL (GAUZE/BANDAGES/DRESSINGS) IMPLANT
BNDG COHESIVE 6X5 TAN STRL LF (GAUZE/BANDAGES/DRESSINGS) ×3 IMPLANT
BNDG GAUZE ELAST 4 BULKY (GAUZE/BANDAGES/DRESSINGS) ×6 IMPLANT
COVER SURGICAL LIGHT HANDLE (MISCELLANEOUS) ×6 IMPLANT
DRAPE U-SHAPE 47X51 STRL (DRAPES) ×3 IMPLANT
DRSG ADAPTIC 3X8 NADH LF (GAUZE/BANDAGES/DRESSINGS) ×3 IMPLANT
DRSG PAD ABDOMINAL 8X10 ST (GAUZE/BANDAGES/DRESSINGS) ×6 IMPLANT
DURAPREP 26ML APPLICATOR (WOUND CARE) ×3 IMPLANT
ELECT CAUTERY BLADE 6.4 (BLADE) IMPLANT
ELECT REM PT RETURN 9FT ADLT (ELECTROSURGICAL)
ELECTRODE REM PT RTRN 9FT ADLT (ELECTROSURGICAL) IMPLANT
GAUZE SPONGE 4X4 12PLY STRL (GAUZE/BANDAGES/DRESSINGS) ×3 IMPLANT
GLOVE BIOGEL PI IND STRL 9 (GLOVE) ×1 IMPLANT
GLOVE BIOGEL PI INDICATOR 9 (GLOVE) ×2
GLOVE SURG ORTHO 9.0 STRL STRW (GLOVE) ×3 IMPLANT
GOWN STRL REUS W/ TWL LRG LVL3 (GOWN DISPOSABLE) ×1 IMPLANT
GOWN STRL REUS W/ TWL XL LVL3 (GOWN DISPOSABLE) ×2 IMPLANT
GOWN STRL REUS W/TWL LRG LVL3 (GOWN DISPOSABLE) ×2
GOWN STRL REUS W/TWL XL LVL3 (GOWN DISPOSABLE) ×4
HANDPIECE INTERPULSE COAX TIP (DISPOSABLE)
KIT BASIN OR (CUSTOM PROCEDURE TRAY) ×3 IMPLANT
KIT ROOM TURNOVER OR (KITS) ×3 IMPLANT
KIT STIMULAN RAPID CURE  10CC (Orthopedic Implant) ×2 IMPLANT
KIT STIMULAN RAPID CURE 10CC (Orthopedic Implant) ×1 IMPLANT
MANIFOLD NEPTUNE II (INSTRUMENTS) ×3 IMPLANT
NS IRRIG 1000ML POUR BTL (IV SOLUTION) ×3 IMPLANT
PACK ORTHO EXTREMITY (CUSTOM PROCEDURE TRAY) ×3 IMPLANT
PAD ARMBOARD 7.5X6 YLW CONV (MISCELLANEOUS) ×6 IMPLANT
SET HNDPC FAN SPRY TIP SCT (DISPOSABLE) IMPLANT
SPONGE GAUZE 4X4 12PLY STER LF (GAUZE/BANDAGES/DRESSINGS) ×3 IMPLANT
STOCKINETTE IMPERVIOUS 9X36 MD (GAUZE/BANDAGES/DRESSINGS) IMPLANT
SUT ETHILON 2 0 PSLX (SUTURE) ×6 IMPLANT
SWAB COLLECTION DEVICE MRSA (MISCELLANEOUS) ×3 IMPLANT
TOWEL OR 17X24 6PK STRL BLUE (TOWEL DISPOSABLE) ×3 IMPLANT
TOWEL OR 17X26 10 PK STRL BLUE (TOWEL DISPOSABLE) ×3 IMPLANT
TUBE ANAEROBIC SPECIMEN COL (MISCELLANEOUS) IMPLANT
TUBE CONNECTING 12'X1/4 (SUCTIONS) ×1
TUBE CONNECTING 12X1/4 (SUCTIONS) ×2 IMPLANT
UNDERPAD 30X30 INCONTINENT (UNDERPADS AND DIAPERS) ×3 IMPLANT
WATER STERILE IRR 1000ML POUR (IV SOLUTION) ×3 IMPLANT
YANKAUER SUCT BULB TIP NO VENT (SUCTIONS) ×3 IMPLANT

## 2014-05-29 NOTE — Anesthesia Procedure Notes (Signed)
Procedure Name: LMA Insertion Date/Time: 05/29/2014 8:10 PM Performed by: Gwenyth AllegraADAMI, Sabino Denning Pre-anesthesia Checklist: Emergency Drugs available, Patient identified, Timeout performed, Suction available and Patient being monitored Patient Re-evaluated:Patient Re-evaluated prior to inductionOxygen Delivery Method: Circle system utilized Preoxygenation: Pre-oxygenation with 100% oxygen Intubation Type: IV induction LMA Size: 5.0 Number of attempts: 1 Placement Confirmation: positive ETCO2 and breath sounds checked- equal and bilateral Tube secured with: Tape Dental Injury: Teeth and Oropharynx as per pre-operative assessment

## 2014-05-29 NOTE — Progress Notes (Signed)
ANTIBIOTIC CONSULT NOTE - INITIAL  Pharmacy Consult for vancomycin Indication: cellulitis/wound infection  Allergies  Allergen Reactions  . Other     ANAESTHESIA MAKES PT VERY SICK ON STOMACH  . Penicillins Other (See Comments)    Upset stomach    Patient Measurements: Height: 5' 11.5" (181.6 cm) Weight: 205 lb (92.987 kg) IBW/kg (Calculated) : 76.45  Vital Signs: Temp: 97.8 F (36.6 C) (04/27 2310) Temp Source: Oral (04/27 2310) BP: 139/70 mmHg (04/27 2310) Pulse Rate: 75 (04/27 2310)   Microbiology: Recent Results (from the past 720 hour(s))  Surgical pcr screen     Status: None   Collection Time: 05/24/14 10:36 AM  Result Value Ref Range Status   MRSA, PCR NEGATIVE NEGATIVE Final   Staphylococcus aureus NEGATIVE NEGATIVE Final    Comment:        The Xpert SA Assay (FDA approved for NASAL specimens in patients over 59 years of age), is one component of a comprehensive surveillance program.  Test performance has been validated by [facility] for patients greater than or equal to 59 year old. It is not intended to diagnose infection nor to guide or monitor treatment.     Medical History: Past Medical History  Diagnosis Date  . PONV (postoperative nausea and vomiting)   . Elevated serum creatinine   . Hypercholesterolemia     Medications:  Prescriptions prior to admission  Medication Sig Dispense Refill Last Dose  . aspirin EC 325 MG tablet Take 1 tablet (325 mg total) by mouth daily. 30 tablet 0 05/28/2014 at Unknown time  . HYDROcodone-acetaminophen (NORCO/VICODIN) 5-325 MG per tablet Take 1 tablet by mouth every 4 (four) hours as needed for moderate pain.   05/29/2014 at 1100  . Multiple Vitamins-Minerals (MULTIVITAMIN WITH MINERALS) tablet Take 1 tablet by mouth daily.   05/28/2014 at Unknown time  . Vitamin D, Ergocalciferol, (DRISDOL) 50000 UNITS CAPS capsule Take 50,000 Units by mouth every 7 (seven) days. Sunday or Monday   Past Week at Unknown time   . ondansetron (ZOFRAN) 4 MG tablet Take 4 mg by mouth every 8 (eight) hours as needed for nausea or vomiting.   Unknown at Unknown time   Scheduled:  . [START ON 05/30/2014] aspirin EC  325 mg Oral Daily  . HYDROmorphone      . HYDROmorphone      . oxyCODONE      . [START ON 05/30/2014] piperacillin-tazobactam (ZOSYN)  IV  3.375 g Intravenous 4 times per day  . vancomycin  1,000 mg Intravenous Q12H   Infusions:  . sodium chloride      Assessment: 59yo male 5d s/p ORIF for ankle fracture/dislocation, c/o increasing redness at surgical site, this evening had excisional debridement w/ antibiotic beads (vanc+gent) placed, to begin IV ABX.  Goal of Therapy:  Vancomycin trough level 10-20 mcg/ml  Plan:  Will begin vancomycin 1250mg  IV Q12H and monitor CBC, Cx, levels prn.  Vernard GamblesVeronda Shevette Frazier, PharmD, BCPS  05/29/2014,11:18 PM

## 2014-05-29 NOTE — Transfer of Care (Signed)
Immediate Anesthesia Transfer of Care Note  Patient: Connor Frazier  Procedure(s) Performed: Procedure(s): IRRIGATION AND DEBRIDEMENT RIGHT ANKLE AND PLACE ANTIBIOTIC BEADS (Right)  Patient Location: PACU  Anesthesia Type:General  Level of Consciousness: awake, alert  and oriented  Airway & Oxygen Therapy: Patient Spontanous Breathing and Patient connected to nasal cannula oxygen  Post-op Assessment: Report given to RN and Post -op Vital signs reviewed and stable  Post vital signs: Reviewed and stable  Last Vitals:  Filed Vitals:   05/29/14 2052  BP:   Pulse: 74  Temp: 36.4 C  Resp: 17    Complications: No apparent anesthesia complications

## 2014-05-29 NOTE — Op Note (Signed)
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 male with a diagnosis of Infected Open Reduction Internal Fixation Right Ankle who failed conservative measures and elected for surgical management.    The risks benefits and alternatives were discussed with the patient preoperatively including but not limited to the risks of infection, bleeding, nerve injury, cardiopulmonary complications, the need for revision surgery, among others, and the patient was willing to proceed.  OPERATIVE IMPLANTS: Antibiotic beads with 1 g vancomycin and 240 mg gentamicin  OPERATIVE FINDINGS: No abscess there was significant amount of soft tissue trauma secondary to the initial fracture dislocation.  OPERATIVE PROCEDURE: Patient was brought to the operating room and underwent a general anesthetic. After adequate levels anesthesia were obtained patient's right lower extremity was prepped using DuraPrep draped into a sterile field. A timeout was called. The staples were removed prior to prepping and draping. The wounds were opened there was some hematoma but no purulence. Cultures were obtained 2 prior to administration of antibiotics with. The wound was irrigated with pulsatile lavage medially and laterally with 3 L. There was a small amount of nonviable tissue which was removed. Of note there was a significant amount of hematoma deep within the tissue. There was no nonviable muscle. Ronjair was used to remove muscle and soft tissue. After further irrigation with pulsatile lavage the medial and lateral wounds were filled with the antibiotic beads with 1 g vancomycin and 240  mg gentamicin. Subcutaneous is closed using 2-0 Vicryl and skin was closed using 2-0 nylon. A sterile compressive dressing was applied. Patient was extubated taken to the PACU in stable condition.  Of note patient's initial PCR screen was negative for MRSA.

## 2014-05-29 NOTE — Anesthesia Preprocedure Evaluation (Signed)
Anesthesia Evaluation  Patient identified by MRN, date of birth, ID band Patient awake    Reviewed: Allergy & Precautions, NPO status , Patient's Chart, lab work & pertinent test results  Airway Mallampati: II  TM Distance: >3 FB Neck ROM: Full    Dental  (+) Teeth Intact, Dental Advisory Given   Pulmonary    breath sounds clear to auscultation       Cardiovascular  Rhythm:Regular Rate:Normal     Neuro/Psych    GI/Hepatic   Endo/Other    Renal/GU      Musculoskeletal   Abdominal   Peds  Hematology   Anesthesia Other Findings   Reproductive/Obstetrics                             Anesthesia Physical Anesthesia Plan  ASA: II  Anesthesia Plan: General   Post-op Pain Management:    Induction: Intravenous  Airway Management Planned: LMA  Additional Equipment:   Intra-op Plan:   Post-operative Plan: Extubation in OR  Informed Consent: I have reviewed the patients History and Physical, chart, labs and discussed the procedure including the risks, benefits and alternatives for the proposed anesthesia with the patient or authorized representative who has indicated his/her understanding and acceptance.   Dental advisory given  Plan Discussed with: CRNA and Anesthesiologist  Anesthesia Plan Comments:        Anesthesia Quick Evaluation  

## 2014-05-29 NOTE — H&P (Signed)
Connor Frazier is an 59 y.o. male.   Chief Complaint: Painful cellulitic 5 days status post open reduction internal fixation ankle fracture dislocation HPI: Patient noticed increasing redness and cellulitis radiating proximally up his leg with cellulitis over the medial and lateral incisions 5 days status post open reduction and fixation for trimalleolar ankle fracture.  Past Medical History  Diagnosis Date  . PONV (postoperative nausea and vomiting)   . Elevated serum creatinine   . Hypercholesterolemia     Past Surgical History  Procedure Laterality Date  . Appendectomy    . Colonoscopy    . Dental surgery      implants   . Orif ankle fracture Right 05/24/2014    Procedure: OPEN REDUCTION INTERNAL FIXATION (ORIF) ANKLE FRACTURE;  Surgeon: Nadara MustardMarcus Janesia Joswick V, MD;  Location: [facility] OR;  Service: Orthopedics;  Laterality: Right;    Family History  Problem Relation Age of Onset  . Dementia Mother   . Diabetes Father   . Heart disease Father   . Cancer - Prostate Father    Social History:  reports that he has never smoked. He has never used smokeless tobacco. He reports that he drinks alcohol. He reports that he does not use illicit drugs.  Allergies:  Allergies  Allergen Reactions  . Other     ANAESTHESIA MAKES PT VERY SICK ON STOMACH  . Penicillins Other (See Comments)    Upset stomach    Medications Prior to Admission  Medication Sig Dispense Refill  . aspirin EC 325 MG tablet Take 1 tablet (325 mg total) by mouth daily. 30 tablet 0  . HYDROcodone-acetaminophen (NORCO/VICODIN) 5-325 MG per tablet Take 1 tablet by mouth every 4 (four) hours as needed for moderate pain.    . Multiple Vitamins-Minerals (MULTIVITAMIN WITH MINERALS) tablet Take 1 tablet by mouth daily.    . Vitamin D, Ergocalciferol, (DRISDOL) 50000 UNITS CAPS capsule Take 50,000 Units by mouth every 7 (seven) days. Sunday or Monday    . ondansetron (ZOFRAN) 4 MG tablet Take 4 mg by mouth every 8 (eight) hours as  needed for nausea or vomiting.      No results found for this or any previous visit (from the past 48 hour(s)). No results found.  Review of Systems  All other systems reviewed and are negative.   Blood pressure 144/76, pulse 74, temperature 97.6 F (36.4 C), temperature source Oral, resp. rate 16, height 5' 11.5" (1.816 m), weight 92.987 kg (205 lb), SpO2 98 %. Physical Exam  On examination patient has redness extends up to the mid calf there is redness around the incisions there is no purulent drainage. Radiographs show stable alignment. Assessment/Plan Assessment: Cellulitis status post open reduction internal fixation right ankle fracture.  Plan: We'll plan for excisional debridement placement of antibiotic beads. Plan for wound cultures and placement of IV antibiotics including vancomycin and Zosyn.  Josiah Nieto V 05/29/2014, 8:55 PM

## 2014-05-30 ENCOUNTER — Encounter (HOSPITAL_COMMUNITY): Payer: Self-pay | Admitting: General Practice

## 2014-05-30 LAB — C-REACTIVE PROTEIN: CRP: 7 mg/dL — ABNORMAL HIGH (ref <0.60)

## 2014-05-30 MED ORDER — DOCUSATE SODIUM 100 MG PO CAPS
100.0000 mg | ORAL_CAPSULE | Freq: Two times a day (BID) | ORAL | Status: DC
  Administered 2014-05-30 – 2014-06-01 (×4): 100 mg via ORAL
  Filled 2014-05-30 (×4): qty 1

## 2014-05-30 NOTE — Progress Notes (Signed)
Patient ID: Connor LeaderSebastian Frazier, male   DOB: [birth year], 59 y.o.   MRN: [id number] Postoperative day 1 irrigation and debridement infected right ankle status post open reduction internal fixation. Patient has no pain with range of motion of his ankle. Patient still has cellulitis over the dorsum of his tibia. Patient is on vancomycin and Zosyn. Ulcers are pending. Dressing is clean and dry.

## 2014-05-30 NOTE — Evaluation (Signed)
Physical Therapy Evaluation and Discharge Patient Details Name: Connor Frazier MRN: [id number] DOB: 1955/11/06 Today's Date: 05/30/2014   History of Present Illness  59 y.o. 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 All education has been completed and the patient has no further questions. Received HHPT at previous discharge and states he progressed very well. Maintains NWB at all times. Safely ambulating up to 150 feet. Awaiting knee scooter to be delivered which he has previously practiced with HHPT. See below for any follow-up Physial Therapy or equipment needs. PT is signing off. Thank you for this referral.     Follow Up Recommendations No PT follow up    Equipment Recommendations  None recommended by PT    Recommendations for Other Services       Precautions / Restrictions Precautions Precautions: Fall Precaution Comments: Reviewed w/ pt the importance of keeping RLE elevated Restrictions Weight Bearing Restrictions: Yes RLE Weight Bearing: Non weight bearing      Mobility  Bed Mobility Overal bed mobility: Modified Independent             General bed mobility comments: extra time  Transfers Overall transfer level: Needs assistance Equipment used: Rolling walker (2 wheeled) Transfers: Sit to/from Stand Sit to Stand: Supervision         General transfer comment: supervision for safety. VC for hand placement. No loss of balance upon standing  Ambulation/Gait Ambulation/Gait assistance: Supervision Ambulation Distance (Feet): 150 Feet Assistive device: Rolling walker (2 wheeled) Gait Pattern/deviations:  ("hop to" pattern) Gait velocity: decreased   General Gait Details: Demonstrates good control of RW. No loss of balance noted. Required one standing rest break  to complete distance. Maintains NWB status at all times.  Stairs            Wheelchair Mobility    Modified Rankin (Stroke Patients Only)       Balance Overall balance assessment: Needs assistance Sitting-balance support: No upper extremity supported;Feet supported Sitting balance-Leahy Scale: Normal     Standing balance support: Bilateral upper extremity supported Standing balance-Leahy Scale: Poor                               Pertinent Vitals/Pain Pain Assessment: 0-10 Pain Score:  (Feels ok until I put it over the bed" no value given) Pain Location: Rt ankle Pain Descriptors / Indicators: Throbbing Pain Intervention(s): Monitored during session;Repositioned    Home Living Family/patient expects to be discharged to:: Private residence Living Arrangements: Spouse/significant other Available Help at Discharge: Family;Available PRN/intermittently Type of Home: House Home Access: Level entry     Home Layout: One level Home Equipment: Crutches;Walker - 2 wheels      Prior Function Level of Independence: Independent with assistive device(s);Independent         Comments: RW at home for mobility, has been training with knee scooter.      Hand Dominance   Dominant Hand: Right    Extremity/Trunk Assessment   Upper Extremity Assessment: Defer to OT evaluation           Lower Extremity Assessment: RLE deficits/detail RLE Deficits / Details: decreased strength and ROM as expected post op- feels light touch       Communication   Communication: No difficulties  Cognition Arousal/Alertness: Awake/alert Behavior During Therapy: WFL for tasks  assessed/performed Overall Cognitive Status: Within Functional Limits for tasks assessed                      General Comments General comments (skin integrity, edema, etc.): States he has a knee scooter ordered. Worked with HHPT but has completed all training.    Exercises Total Joint  Exercises Ankle Circles/Pumps: AROM;Both;10 reps;Seated Heel Slides: Right;Both;10 reps;Seated      Assessment/Plan    PT Assessment Patent does not need any further PT services  PT Diagnosis Difficulty walking;Abnormality of gait;Acute pain   PT Problem List Decreased strength;Decreased range of motion;Decreased activity tolerance;Decreased balance;Decreased mobility;Pain  PT Treatment Interventions     PT Goals (Current goals can be found in the Care Plan section) Acute Rehab PT Goals Patient Stated Goal: Go home PT Goal Formulation: All assessment and education complete, DC therapy    Frequency     Barriers to discharge        Co-evaluation               End of Session Equipment Utilized During Treatment: Gait belt Activity Tolerance: Patient tolerated treatment well;No increased pain Patient left: in chair;with call bell/phone within reach Nurse Communication: Mobility status         Time: 1131-1150 PT Time Calculation (min) (ACUTE ONLY): 19 min   Charges:   PT Evaluation $Initial PT Evaluation Tier I: 1 Procedure     PT G CodesBerton Frazier 05/30/2014, 12:53 PM Connor Frazier Connor Frazier, Connor Frazier [phone]

## 2014-05-30 NOTE — Anesthesia Postprocedure Evaluation (Signed)
  Anesthesia Post-op Note  Patient: Connor Frazier  Procedure(s) Performed: Procedure(s): IRRIGATION AND DEBRIDEMENT RIGHT ANKLE AND PLACE ANTIBIOTIC BEADS (Right)  Patient Location: PACU  Anesthesia Type:General  Level of Consciousness: awake, alert  and oriented  Airway and Oxygen Therapy: Patient Spontanous Breathing and Patient connected to nasal cannula oxygen  Post-op Pain: mild  Post-op Assessment: Post-op Vital signs reviewed, Patient's Cardiovascular Status Stable, Respiratory Function Stable, Patent Airway and Pain level controlled  Post-op Vital Signs: stable  Last Vitals:  Filed Vitals:   05/29/14 2324  BP: 139/70  Pulse: 75  Temp: 36.6 C  Resp: 18    Complications: No apparent anesthesia complications

## 2014-05-30 NOTE — Discharge Summary (Signed)
Physician Discharge Summary  Patient ID: Connor Frazier MRN: [id number] DOB/AGE: 10-15-1955 59 y.o.  Admit date: 05/24/2014 Discharge date: 05/30/2014  Admission Diagnoses: Right ankle fracture dislocation  Discharge Diagnoses:  Active Problems:   Trimalleolar fracture of ankle, closed   Discharged Condition: stable  Hospital Course: Patient's hospital course was essentially unremarkable. He underwent open reduction internal fixation of the right ankle fracture postoperatively he progressed well and was discharged to home in stable condition.  Consults: None  Significant Diagnostic Studies: labs: Routine labs  Treatments: surgery: See operative note  Discharge Exam: Blood pressure 124/74, pulse 73, temperature 97.9 F (36.6 C), temperature source Oral, resp. rate 16, height 5' 11.5" (1.816 m), weight 92.987 kg (205 lb), SpO2 99 %. Incision/Wound: dressing clean and dry  Disposition: 01-Home or Self Care  Discharge Instructions    Apply cam walker    Complete by:  As directed   Laterality:  Right     Call MD / Call 911    Complete by:  As directed   If you experience chest pain or shortness of breath, CALL 911 and be transported to the hospital emergency room.  If you develope a fever above 101 F, pus (white drainage) or increased drainage or redness at the wound, or calf pain, call your surgeon's office.     Call MD / Call 911    Complete by:  As directed   If you experience chest pain or shortness of breath, CALL 911 and be transported to the hospital emergency room.  If you develope a fever above 101 F, pus (white drainage) or increased drainage or redness at the wound, or calf pain, call your surgeon's office.     Constipation Prevention    Complete by:  As directed   Drink plenty of fluids.  Prune juice may be helpful.  You may use a stool softener, such as Colace (over the counter) 100 mg twice a day.  Use MiraLax (over the counter) for constipation as needed.     Constipation Prevention    Complete by:  As directed   Drink plenty of fluids.  Prune juice may be helpful.  You may use a stool softener, such as Colace (over the counter) 100 mg twice a day.  Use MiraLax (over the counter) for constipation as needed.     Diet - low sodium heart healthy    Complete by:  As directed      Diet - low sodium heart healthy    Complete by:  As directed      Elevate operative extremity    Complete by:  As directed      Increase activity slowly as tolerated    Complete by:  As directed      Increase activity slowly as tolerated    Complete by:  As directed      Touch down weight bearing    Complete by:  As directed   Laterality:  right  Extremity:  Lower            Medication List    TAKE these medications        aspirin EC 325 MG tablet  Take 1 tablet (325 mg total) by mouth daily.     HYDROcodone-acetaminophen 5-325 MG per tablet  Commonly known as:  NORCO/VICODIN  Take 1 tablet by mouth every 4 (four) hours as needed for moderate pain.     multivitamin with minerals tablet  Take 1 tablet by mouth daily.  ondansetron 4 MG tablet  Commonly known as:  ZOFRAN  Take 4 mg by mouth every 8 (eight) hours as needed for nausea or vomiting.     Vitamin D (Ergocalciferol) 50000 UNITS Caps capsule  Commonly known as:  DRISDOL  Take 50,000 Units by mouth every 7 (seven) days. Sunday or Monday           Follow-up Information    Follow up with Connor Millspaugh V, MD In 1 week.   Specialty:  Orthopedic Surgery   Contact information:   [address] [place] [address] [phone]       Signed: Nadara Frazier,Connor Bruss Frazier 05/30/2014, 9:17 PM

## 2014-05-31 ENCOUNTER — Other Ambulatory Visit (HOSPITAL_COMMUNITY): Payer: Self-pay | Admitting: Orthopedic Surgery

## 2014-05-31 LAB — CBC WITH DIFFERENTIAL/PLATELET
BASOS ABS: 0 10*3/uL (ref 0.0–0.1)
BASOS PCT: 0 % (ref 0–1)
EOS PCT: 1 % (ref 0–5)
Eosinophils Absolute: 0.1 10*3/uL (ref 0.0–0.7)
HEMATOCRIT: 42.2 % (ref 39.0–52.0)
HEMOGLOBIN: 14 g/dL (ref 13.0–17.0)
Lymphocytes Relative: 20 % (ref 12–46)
Lymphs Abs: 2.4 10*3/uL (ref 0.7–4.0)
MCH: 29.2 pg (ref 26.0–34.0)
MCHC: 33.2 g/dL (ref 30.0–36.0)
MCV: 87.9 fL (ref 78.0–100.0)
MONO ABS: 1.6 10*3/uL — AB (ref 0.1–1.0)
MONOS PCT: 14 % — AB (ref 3–12)
NEUTROS ABS: 7.8 10*3/uL — AB (ref 1.7–7.7)
Neutrophils Relative %: 65 % (ref 43–77)
Platelets: 257 10*3/uL (ref 150–400)
RBC: 4.8 MIL/uL (ref 4.22–5.81)
RDW: 12.3 % (ref 11.5–15.5)
WBC: 11.9 10*3/uL — AB (ref 4.0–10.5)

## 2014-05-31 LAB — BASIC METABOLIC PANEL
Anion gap: 9 (ref 5–15)
BUN: 16 mg/dL (ref 6–23)
CALCIUM: 9.3 mg/dL (ref 8.4–10.5)
CHLORIDE: 102 mmol/L (ref 96–112)
CO2: 27 mmol/L (ref 19–32)
CREATININE: 1.83 mg/dL — AB (ref 0.50–1.35)
GFR calc Af Amer: 45 mL/min — ABNORMAL LOW (ref >90)
GFR calc non Af Amer: 39 mL/min — ABNORMAL LOW (ref >90)
Glucose, Bld: 91 mg/dL (ref 70–99)
Potassium: 5 mmol/L (ref 3.5–5.1)
Sodium: 138 mmol/L (ref 135–145)

## 2014-05-31 MED ORDER — ASPIRIN EC 325 MG PO TBEC: 325.0000 mg | DELAYED_RELEASE_TABLET | Freq: Every day | ORAL | Status: DC

## 2014-05-31 MED ORDER — DOXYCYCLINE HYCLATE 50 MG PO CAPS: 100.0000 mg | ORAL_CAPSULE | Freq: Two times a day (BID) | ORAL | Status: DC

## 2014-05-31 MED ORDER — VANCOMYCIN HCL IN DEXTROSE 750-5 MG/150ML-% IV SOLN
750.0000 mg | Freq: Two times a day (BID) | INTRAVENOUS | Status: DC
  Administered 2014-06-01: 750 mg via INTRAVENOUS
  Filled 2014-05-31 (×2): qty 150

## 2014-05-31 NOTE — Discharge Summary (Signed)
Physician Discharge Summary  Patient ID: Connor Frazier MRN: [id number] DOB/AGE: November 26, 1955 59 y.o.  Admit date: 05/29/2014 Discharge date: 05/31/2014  Admission Diagnoses: Infected right ankle status post open reduction internal fixation  Discharge Diagnoses:  Active Problems:   Wound infection complicating hardware   Discharged Condition: stable  Hospital Course: Patient's hospital course was essentially unremarkable. He underwent irrigation debridement with excision of skin and soft tissue with placement of antibiotic beads. Postoperatively the cellulitis resolved nicely.  Consults: None  Significant Diagnostic Studies: labs: Routine labs  Treatments: surgery: See operative note  Discharge Exam: Blood pressure 135/70, pulse 58, temperature 97.4 F (36.3 C), temperature source Oral, resp. rate 20, height 5' 11.5" (1.816 m), weight 90.765 kg (200 lb 1.6 oz), SpO2 99 %. Incision/Wound: dressing clean and dry  Disposition: 01-Home or Self Care     Medication List    ASK your doctor about these medications        aspirin EC 325 MG tablet  Take 1 tablet (325 mg total) by mouth daily.     HYDROcodone-acetaminophen 5-325 MG per tablet  Commonly known as:  NORCO/VICODIN  Take 1 tablet by mouth every 4 (four) hours as needed for moderate pain.     multivitamin with minerals tablet  Take 1 tablet by mouth daily.     ondansetron 4 MG tablet  Commonly known as:  ZOFRAN  Take 4 mg by mouth every 8 (eight) hours as needed for nausea or vomiting.     Vitamin D (Ergocalciferol) 50000 UNITS Caps capsule  Commonly known as:  DRISDOL  Take 50,000 Units by mouth every 7 (seven) days. Sunday or Monday           Follow-up Information    Follow up with Didi Ganaway V, MD In 1 week.   Specialty:  Orthopedic Surgery   Contact information:   [address] [place] [address] [phone]       Signed: Nadara MustardDUDA,Jamaira Sherk V 05/31/2014, 7:09 AM

## 2014-05-31 NOTE — Progress Notes (Deleted)
Patient ID: Connor Frazier, male   DOB: 08/20/1955, 58 y.o.   MRN: [id number]  

## 2014-05-31 NOTE — Progress Notes (Signed)
Patient ID: Connor LeaderSebastian Frazier, male   DOB: [birth year], 59 y.o.   MRN: [id number] Patient's white blood cell and C-reactive protein are mildly elevated. The cellulitis in his leg has resolved. After discussion with the patient and his wife it was decided to continue one additional day of the IV antibiotics. The increased risk for C. difficile was discussed. Patient states he understands recommended probiotics to minimize risk of C. difficile. Plan to follow-up in the office next week. Oral doxycycline at discharge. Plan for discharge Saturday morning. Orders written for discharge Saturday.

## 2014-05-31 NOTE — Progress Notes (Signed)
Patient ID: [name], male   DOB: [birth year], 59 y.o.   MRN: [id number]

## 2014-06-01 LAB — WOUND CULTURE: Culture: NO GROWTH

## 2014-06-01 LAB — CBC WITH DIFFERENTIAL/PLATELET
BASOS ABS: 0 10*3/uL (ref 0.0–0.1)
BASOS PCT: 0 % (ref 0–1)
EOS ABS: 0.2 10*3/uL (ref 0.0–0.7)
Eosinophils Relative: 2 % (ref 0–5)
HEMATOCRIT: 41.1 % (ref 39.0–52.0)
Hemoglobin: 13.7 g/dL (ref 13.0–17.0)
Lymphocytes Relative: 14 % (ref 12–46)
Lymphs Abs: 1.6 10*3/uL (ref 0.7–4.0)
MCH: 29.3 pg (ref 26.0–34.0)
MCHC: 33.3 g/dL (ref 30.0–36.0)
MCV: 88 fL (ref 78.0–100.0)
Monocytes Absolute: 1.7 10*3/uL — ABNORMAL HIGH (ref 0.1–1.0)
Monocytes Relative: 15 % — ABNORMAL HIGH (ref 3–12)
Neutro Abs: 7.6 10*3/uL (ref 1.7–7.7)
Neutrophils Relative %: 69 % (ref 43–77)
PLATELETS: 246 10*3/uL (ref 150–400)
RBC: 4.67 MIL/uL (ref 4.22–5.81)
RDW: 12.4 % (ref 11.5–15.5)
WBC: 11.1 10*3/uL — AB (ref 4.0–10.5)

## 2014-06-01 NOTE — Discharge Instructions (Signed)
Foot elevated. Take medication as directed by Dr. Lajoyce Cornersuda. See him next week

## 2014-06-01 NOTE — Progress Notes (Signed)
Patient DC'd home via car with family.  DC instructions and prescription given to patient.  Both fully understood.  Vital signs and assessments were stable.  

## 2014-06-01 NOTE — Progress Notes (Signed)
Patient ID: [name], male   DOB: 1955/05/10, 59 y.o.   MRN: [id number] Patient  Ready for discharge.  Follow up with Dr. Lajoyce Cornersuda 1 to 2 wks

## 2014-06-03 LAB — ANAEROBIC CULTURE

## 2014-06-19 ENCOUNTER — Inpatient Hospital Stay (HOSPITAL_COMMUNITY)
Admission: AD | Admit: 2014-06-19 | Discharge: 2014-06-28 | DRG: 493 | Disposition: A | Discharge status: Home Health | Payer: BLUE CROSS/BLUE SHIELD | Attending: Orthopedic Surgery | Admitting: Orthopedic Surgery

## 2014-06-19 DIAGNOSIS — Z884 Allergy status to anesthetic agent status: Secondary | ICD-10-CM

## 2014-06-19 DIAGNOSIS — Z79899 Other long term (current) drug therapy: Secondary | ICD-10-CM

## 2014-06-19 DIAGNOSIS — T84622A Infection and inflammatory reaction due to internal fixation device of right tibia, initial encounter: Principal | ICD-10-CM | POA: Diagnosis present

## 2014-06-19 DIAGNOSIS — Y838 Other surgical procedures as the cause of abnormal reaction of the patient, or of later complication, without mention of misadventure at the time of the procedure: Secondary | ICD-10-CM | POA: Diagnosis not present

## 2014-06-19 DIAGNOSIS — T847XXD Infection and inflammatory reaction due to other internal orthopedic prosthetic devices, implants and grafts, subsequent encounter: Secondary | ICD-10-CM | POA: Diagnosis not present

## 2014-06-19 DIAGNOSIS — B9689 Other specified bacterial agents as the cause of diseases classified elsewhere: Secondary | ICD-10-CM | POA: Diagnosis not present

## 2014-06-19 DIAGNOSIS — N183 Chronic kidney disease, stage 3 (moderate): Secondary | ICD-10-CM | POA: Diagnosis present

## 2014-06-19 DIAGNOSIS — Z880 Allergy status to penicillin: Secondary | ICD-10-CM | POA: Diagnosis not present

## 2014-06-19 DIAGNOSIS — N189 Chronic kidney disease, unspecified: Secondary | ICD-10-CM | POA: Insufficient documentation

## 2014-06-19 DIAGNOSIS — Z792 Long term (current) use of antibiotics: Secondary | ICD-10-CM | POA: Diagnosis not present

## 2014-06-19 DIAGNOSIS — B999 Unspecified infectious disease: Secondary | ICD-10-CM

## 2014-06-19 DIAGNOSIS — T8459XA Infection and inflammatory reaction due to other internal joint prosthesis, initial encounter: Secondary | ICD-10-CM | POA: Diagnosis not present

## 2014-06-19 DIAGNOSIS — T8131XA Disruption of external operation (surgical) wound, not elsewhere classified, initial encounter: Secondary | ICD-10-CM | POA: Diagnosis present

## 2014-06-19 DIAGNOSIS — T847XXA Infection and inflammatory reaction due to other internal orthopedic prosthetic devices, implants and grafts, initial encounter: Secondary | ICD-10-CM | POA: Diagnosis present

## 2014-06-19 DIAGNOSIS — N179 Acute kidney failure, unspecified: Secondary | ICD-10-CM | POA: Diagnosis present

## 2014-06-19 DIAGNOSIS — N039 Chronic nephritic syndrome with unspecified morphologic changes: Secondary | ICD-10-CM | POA: Diagnosis present

## 2014-06-19 DIAGNOSIS — Z9889 Other specified postprocedural states: Secondary | ICD-10-CM | POA: Diagnosis not present

## 2014-06-19 DIAGNOSIS — Z79891 Long term (current) use of opiate analgesic: Secondary | ICD-10-CM

## 2014-06-19 DIAGNOSIS — T8130XA Disruption of wound, unspecified, initial encounter: Secondary | ICD-10-CM | POA: Diagnosis present

## 2014-06-19 DIAGNOSIS — M009 Pyogenic arthritis, unspecified: Secondary | ICD-10-CM | POA: Diagnosis present

## 2014-06-19 DIAGNOSIS — Z7982 Long term (current) use of aspirin: Secondary | ICD-10-CM | POA: Diagnosis not present

## 2014-06-19 LAB — CBC WITH DIFFERENTIAL/PLATELET
BASOS ABS: 0 10*3/uL (ref 0.0–0.1)
Basophils Relative: 0 % (ref 0–1)
EOS PCT: 2 % (ref 0–5)
Eosinophils Absolute: 0.2 10*3/uL (ref 0.0–0.7)
HCT: 45.6 % (ref 39.0–52.0)
Hemoglobin: 14.8 g/dL (ref 13.0–17.0)
LYMPHS PCT: 23 % (ref 12–46)
Lymphs Abs: 1.5 10*3/uL (ref 0.7–4.0)
MCH: 28.6 pg (ref 26.0–34.0)
MCHC: 32.5 g/dL (ref 30.0–36.0)
MCV: 88.2 fL (ref 78.0–100.0)
MONO ABS: 0.4 10*3/uL (ref 0.1–1.0)
Monocytes Relative: 6 % (ref 3–12)
Neutro Abs: 4.5 10*3/uL (ref 1.7–7.7)
Neutrophils Relative %: 69 % (ref 43–77)
PLATELETS: 195 10*3/uL (ref 150–400)
RBC: 5.17 MIL/uL (ref 4.22–5.81)
RDW: 12.5 % (ref 11.5–15.5)
WBC: 6.6 10*3/uL (ref 4.0–10.5)

## 2014-06-19 LAB — SEDIMENTATION RATE: Sed Rate: 15 mm/hr (ref 0–16)

## 2014-06-19 LAB — COMPREHENSIVE METABOLIC PANEL
ALBUMIN: 3.6 g/dL (ref 3.5–5.0)
ALK PHOS: 52 U/L (ref 38–126)
ALT: 37 U/L (ref 17–63)
AST: 23 U/L (ref 15–41)
Anion gap: 10 (ref 5–15)
BILIRUBIN TOTAL: 0.6 mg/dL (ref 0.3–1.2)
BUN: 14 mg/dL (ref 6–20)
CHLORIDE: 103 mmol/L (ref 101–111)
CO2: 26 mmol/L (ref 22–32)
Calcium: 9.5 mg/dL (ref 8.9–10.3)
Creatinine, Ser: 1.46 mg/dL — ABNORMAL HIGH (ref 0.61–1.24)
GFR calc Af Amer: 59 mL/min — ABNORMAL LOW (ref >60)
GFR, EST NON AFRICAN AMERICAN: 51 mL/min — AB (ref >60)
GLUCOSE: 100 mg/dL — AB (ref 65–99)
POTASSIUM: 4.4 mmol/L (ref 3.5–5.1)
Sodium: 139 mmol/L (ref 135–145)
Total Protein: 7.3 g/dL (ref 6.5–8.1)

## 2014-06-19 LAB — C-REACTIVE PROTEIN: CRP: 0.5 mg/dL (ref <1.0)

## 2014-06-19 LAB — VANCOMYCIN, RANDOM: Vancomycin Rm: <4 ug/mL

## 2014-06-19 LAB — TSH: TSH: 2.308 u[IU]/mL (ref 0.350–4.500)

## 2014-06-19 MED ORDER — SODIUM CHLORIDE 0.9 % IV SOLN
INTRAVENOUS | Status: DC
  Administered 2014-06-20: via INTRAVENOUS

## 2014-06-19 MED ORDER — OXYCODONE HCL 5 MG PO TABS
5.0000 mg | ORAL_TABLET | ORAL | Status: DC | PRN
  Administered 2014-06-21: 5 mg via ORAL
  Filled 2014-06-19: qty 1

## 2014-06-19 MED ORDER — ASPIRIN EC 325 MG PO TBEC
325.0000 mg | DELAYED_RELEASE_TABLET | Freq: Every day | ORAL | Status: DC
  Administered 2014-06-19: 325 mg via ORAL
  Filled 2014-06-19 (×2): qty 1

## 2014-06-19 MED ORDER — HYDROMORPHONE HCL 1 MG/ML IJ SOLN: 1.0000 mg | INTRAMUSCULAR | Status: DC | PRN

## 2014-06-19 NOTE — Consult Note (Signed)
Reason for Consult:ARF Referring Physician: Lajoyce Cornersuda, MD  Wilmon PaliSebastian Frazier is an 59 y.o. male.  HPI: Pt is a 59yo M with PMH sig for CKD stage 2 (baseline Scr 1.5, due to unknown chronic glomerulonephritis and followed in [facility] and in [place] 4 years ago) and more recently right ankle fracture s/p ORIF on 05/24/14 complicated by infection of wound and hardware 5 days post-op.  He underwent debridement on 05/29/14 and was treated with IV zosyn, Vancomycin, and had Vanc and gent antibiotic beads placed in wound who is being readmitted for further management of infected hardware following ORIF.  We were asked to see the patient to further evaluate and manage his ARF.  His Scr was rising at time of discharge and his wife was concerned about proceeding with surgery without being evaluated by Nephrology.  Labs are currently pending.    He denies any fevers, chills, nausea, vomiting, tenderness to right ankle, diarrhea, dysuria, pyuria, hematuria, urinary retention, or urgency.  Trend in Creatinine: CREATININE, SER  Date/Time Value Ref Range Status  05/31/2014 04:28 AM 1.83* 0.50 - 1.35 mg/dL Final  16/10/960404/22/2016 [id number] AM 1.53* 0.50 - 1.35 mg/dL Final    PMH:   Past Medical History  Diagnosis Date  . PONV (postoperative nausea and vomiting)   . Elevated serum creatinine   . Hypercholesterolemia     PSH:   Past Surgical History  Procedure Laterality Date  . Appendectomy    . Colonoscopy    . Dental surgery      implants   . Orif ankle fracture Right 05/24/2014    Procedure: OPEN REDUCTION INTERNAL FIXATION (ORIF) ANKLE FRACTURE;  Surgeon: Nadara MustardMarcus Duda V, MD;  Location: [facility] OR;  Service: Orthopedics;  Laterality: Right;  . Irrigation and debridement abscess Right 05/29/2014    rt ankle   . I&d extremity Right 05/29/2014    Procedure: IRRIGATION AND DEBRIDEMENT RIGHT ANKLE AND PLACE ANTIBIOTIC BEADS;  Surgeon: Nadara MustardMarcus Duda V, MD;  Location: [facility] OR;  Service: Orthopedics;  Laterality: Right;     Allergies:  Allergies  Allergen Reactions  . Other     ANAESTHESIA MAKES PT VERY SICK ON STOMACH  . Penicillins Other (See Comments)    Upset stomach    Medications:   Prior to Admission medications   Medication Sig Start Date End Date Taking? Authorizing Provider  aspirin EC 325 MG tablet Take 1 tablet (325 mg total) by mouth daily. 05/24/14   Nadara MustardMarcus Duda V, MD  aspirin EC 325 MG tablet Take 1 tablet (325 mg total) by mouth daily. 05/31/14   Nadara MustardMarcus Duda V, MD  doxycycline (VIBRAMYCIN) 50 MG capsule Take 2 capsules (100 mg total) by mouth 2 (two) times daily. 05/31/14   Nadara MustardMarcus Duda V, MD  HYDROcodone-acetaminophen (NORCO/VICODIN) 5-325 MG per tablet Take 1 tablet by mouth every 4 (four) hours as needed for moderate pain.    Historical Provider, MD  Multiple Vitamins-Minerals (MULTIVITAMIN WITH MINERALS) tablet Take 1 tablet by mouth daily.    Historical Provider, MD  ondansetron (ZOFRAN) 4 MG tablet Take 4 mg by mouth every 8 (eight) hours as needed for nausea or vomiting.    Historical Provider, MD  Vitamin D, Ergocalciferol, (DRISDOL) 50000 UNITS CAPS capsule Take 50,000 Units by mouth every 7 (seven) days. Sunday or Monday    Historical Provider, MD    Inpatient medications:   Discontinued Meds:  There are no discontinued medications.  Social History:  reports that he has never smoked. He has never  used smokeless tobacco. He reports that he drinks alcohol. He reports that he does not use illicit drugs.  Family History:   Family History  Problem Relation Age of Onset  . Dementia Mother   . Diabetes Father   . Heart disease Father   . Cancer - Prostate Father     A comprehensive review of systems was negative except for: Musculoskeletal: positive for drainage from incision/wound on right ankle Weight change:  No intake or output data in the 24 hours ending 06/19/14 1927 BP 131/77 mmHg  Pulse 66  Temp(Src) 97.9 F (36.6 C)  Resp 16  SpO2 98% Filed Vitals:   06/19/14  1849  BP: 131/77  Pulse: 66  Temp: 97.9 F (36.6 C)  Resp: 16  SpO2: 98%     General appearance: alert, cooperative and no distress Head: Normocephalic, without obvious abnormality, atraumatic Eyes: negative findings: lids and lashes normal, conjunctivae and sclerae normal and corneas clear Neck: no adenopathy, no carotid bruit, no JVD, supple, symmetrical, trachea midline and thyroid not enlarged, symmetric, no tenderness/mass/nodules Resp: clear to auscultation bilaterally Cardio: regular rate and rhythm, S1, S2 normal, no murmur, click, rub or gallop GI: soft, non-tender; bowel sounds normal; no masses,  no organomegaly Extremities: no edema, right ankle wrapped  Labs: Basic Metabolic Panel: No results for input(s): NA, K, CL, CO2, GLUCOSE, BUN, CREATININE, ALBUMIN, CALCIUM, PHOS in the last 168 hours. Liver Function Tests: No results for input(s): AST, ALT, ALKPHOS, BILITOT, PROT, ALBUMIN in the last 168 hours. No results for input(s): LIPASE, AMYLASE in the last 168 hours. No results for input(s): AMMONIA in the last 168 hours. CBC: No results for input(s): WBC, NEUTROABS, HGB, HCT, MCV, PLT in the last 168 hours. PT/INR: @LABRCNTIP (inr:5) Cardiac Enzymes: )No results for input(s): CKTOTAL, CKMB, CKMBINDEX, TROPONINI in the last 168 hours. CBG: No results for input(s): GLUCAP in the last 168 hours.  Iron Studies: No results for input(s): IRON, TIBC, TRANSFERRIN, FERRITIN in the last 168 hours.  Xrays/Other Studies: No results found.   Assessment/Plan: 1.  AKI/CKD- pt with h/o CKD stage 2 presumably due to chronic glomerulonephritis (first diagnosed 3 years ago in [place] and [place] [facility] [place] Isabella Frazier but has never had a renal biopsy).  His Scr has risen from baseline of 1.53 to 1.83 at time of discharge but has not had any repeat labs.  He was treated with IV Vancomycin and zosyn as well as had vancomycin and gentamycin beads implanted to wound.  Will  recheck labs today.  Will also obtain 24 hour urine for protein as this has not been quantified.  Possibly IgA Nephropathy given h/o microscopic hematuria, stable renal function, and non-nephrotic proteinuria.  He also does not have h/o HTN or DM. 2. Infected hardware- plan for surgery once cleared.  Awaiting ID evaluation 3. Right ankle fracture s/p ORIF- as above per Dr. Lajoyce Frazier 4.    Connor Frazier A 06/19/2014, 7:27 PM

## 2014-06-20 ENCOUNTER — Encounter (HOSPITAL_COMMUNITY): Payer: Self-pay

## 2014-06-20 ENCOUNTER — Inpatient Hospital Stay (HOSPITAL_COMMUNITY): Payer: BLUE CROSS/BLUE SHIELD

## 2014-06-20 ENCOUNTER — Other Ambulatory Visit (HOSPITAL_COMMUNITY): Payer: Self-pay | Admitting: Orthopedic Surgery

## 2014-06-20 DIAGNOSIS — N183 Chronic kidney disease, stage 3 unspecified: Secondary | ICD-10-CM | POA: Insufficient documentation

## 2014-06-20 DIAGNOSIS — T847XXA Infection and inflammatory reaction due to other internal orthopedic prosthetic devices, implants and grafts, initial encounter: Secondary | ICD-10-CM

## 2014-06-20 LAB — SEDIMENTATION RATE: Sed Rate: 10 mm/hr (ref 0–16)

## 2014-06-20 LAB — URINALYSIS, ROUTINE W REFLEX MICROSCOPIC
Bilirubin Urine: NEGATIVE
Glucose, UA: NEGATIVE mg/dL
Hgb urine dipstick: NEGATIVE
KETONES UR: NEGATIVE mg/dL
LEUKOCYTES UA: NEGATIVE
NITRITE: NEGATIVE
PROTEIN: NEGATIVE mg/dL
Specific Gravity, Urine: 1.02 (ref 1.005–1.030)
UROBILINOGEN UA: 0.2 mg/dL (ref 0.0–1.0)
pH: 5 (ref 5.0–8.0)

## 2014-06-20 LAB — CBC WITH DIFFERENTIAL/PLATELET
BASOS ABS: 0 10*3/uL (ref 0.0–0.1)
BASOS PCT: 1 % (ref 0–1)
Eosinophils Absolute: 0.3 10*3/uL (ref 0.0–0.7)
Eosinophils Relative: 5 % (ref 0–5)
HCT: 42.2 % (ref 39.0–52.0)
Hemoglobin: 14.2 g/dL (ref 13.0–17.0)
Lymphocytes Relative: 33 % (ref 12–46)
Lymphs Abs: 2.1 10*3/uL (ref 0.7–4.0)
MCH: 29.6 pg (ref 26.0–34.0)
MCHC: 33.6 g/dL (ref 30.0–36.0)
MCV: 87.9 fL (ref 78.0–100.0)
Monocytes Absolute: 0.6 10*3/uL (ref 0.1–1.0)
Monocytes Relative: 9 % (ref 3–12)
NEUTROS ABS: 3.4 10*3/uL (ref 1.7–7.7)
NEUTROS PCT: 52 % (ref 43–77)
PLATELETS: 181 10*3/uL (ref 150–400)
RBC: 4.8 MIL/uL (ref 4.22–5.81)
RDW: 12.7 % (ref 11.5–15.5)
WBC: 6.5 10*3/uL (ref 4.0–10.5)

## 2014-06-20 LAB — RENAL FUNCTION PANEL
ALBUMIN: 3 g/dL — AB (ref 3.5–5.0)
Anion gap: 9 (ref 5–15)
BUN: 17 mg/dL (ref 6–20)
CO2: 24 mmol/L (ref 22–32)
Calcium: 8.7 mg/dL — ABNORMAL LOW (ref 8.9–10.3)
Chloride: 107 mmol/L (ref 101–111)
Creatinine, Ser: 1.44 mg/dL — ABNORMAL HIGH (ref 0.61–1.24)
GFR calc non Af Amer: 52 mL/min — ABNORMAL LOW (ref >60)
Glucose, Bld: 81 mg/dL (ref 65–99)
PHOSPHORUS: 4.7 mg/dL — AB (ref 2.5–4.6)
POTASSIUM: 3.9 mmol/L (ref 3.5–5.1)
SODIUM: 140 mmol/L (ref 135–145)

## 2014-06-20 LAB — C-REACTIVE PROTEIN

## 2014-06-20 LAB — SODIUM, URINE, RANDOM: SODIUM UR: 103 mmol/L

## 2014-06-20 LAB — HIV ANTIBODY (ROUTINE TESTING W REFLEX): HIV Screen 4th Generation wRfx: NONREACTIVE

## 2014-06-20 LAB — CREATININE, URINE, RANDOM: Creatinine, Urine: 186.76 mg/dL

## 2014-06-20 NOTE — H&P (Signed)
Connor Frazier is an 59 y.o. male.   Chief Complaint: Dehiscence opening reduction internal fixation ankle fracture. HPI: Patient is a 59 year old gentleman who initially underwent open reduction internal fixation right ankle fracture on 4/22. Patient was seen on 4/27 and had cellulitis. Patient underwent irrigation and debridement placement of antibiotic beads with vancomycin and gentamicin. Patient return to the office yesterday with wound dehiscence. Patient is admitted at this time for removal of deep retained hardware infectious disease consult. Patient also has a history of renal disease and patient and family request nephrology consult.  Past Medical History  Diagnosis Date  . PONV (postoperative nausea and vomiting)   . Elevated serum creatinine   . Hypercholesterolemia     Past Surgical History  Procedure Laterality Date  . Appendectomy    . Colonoscopy    . Dental surgery      implants   . Orif ankle fracture Right 05/24/2014    Procedure: OPEN REDUCTION INTERNAL FIXATION (ORIF) ANKLE FRACTURE;  Surgeon: Newt Minion, MD;  Location: [facility];  Service: Orthopedics;  Laterality: Right;  . Irrigation and debridement abscess Right 05/29/2014    rt ankle   . I&d extremity Right 05/29/2014    Procedure: IRRIGATION AND DEBRIDEMENT RIGHT ANKLE AND PLACE ANTIBIOTIC BEADS;  Surgeon: Newt Minion, MD;  Location: [facility];  Service: Orthopedics;  Laterality: Right;    Family History  Problem Relation Age of Onset  . Dementia Mother   . Diabetes Father   . Heart disease Father   . Cancer - Prostate Father    Social History:  reports that he has never smoked. He has never used smokeless tobacco. He reports that he drinks alcohol. He reports that he does not use illicit drugs.  Allergies:  Allergies  Allergen Reactions  . Other     ANAESTHESIA MAKES PT VERY SICK ON STOMACH  . Penicillins Other (See Comments)    Upset stomach    Medications Prior to Admission  Medication Sig  Dispense Refill  . aspirin EC 325 MG tablet Take 1 tablet (325 mg total) by mouth daily. 30 tablet 0  . HYDROcodone-acetaminophen (NORCO/VICODIN) 5-325 MG per tablet Take 1 tablet by mouth every 4 (four) hours as needed for moderate pain.    . Multiple Vitamins-Minerals (MULTIVITAMIN WITH MINERALS) tablet Take 1 tablet by mouth daily.    . ondansetron (ZOFRAN) 4 MG tablet Take 4 mg by mouth every 8 (eight) hours as needed for nausea or vomiting.    . Probiotic Product (PROBIOTIC DAILY PO) Take 1 tablet by mouth daily.    . Vitamin D, Ergocalciferol, (DRISDOL) 50000 UNITS CAPS capsule Take 50,000 Units by mouth every 7 (seven) days. Sunday or Monday      Results for orders placed or performed during the hospital encounter of 06/19/14 (from the past 48 hour(s))  Comprehensive metabolic panel     Status: Abnormal   Collection Time: 06/19/14  8:23 PM  Result Value Ref Range   Sodium 139 135 - 145 mmol/L   Potassium 4.4 3.5 - 5.1 mmol/L   Chloride 103 101 - 111 mmol/L   CO2 26 22 - 32 mmol/L   Glucose, Bld 100 (H) 65 - 99 mg/dL   BUN 14 6 - 20 mg/dL   Creatinine, Ser 1.46 (H) 0.61 - 1.24 mg/dL   Calcium 9.5 8.9 - 10.3 mg/dL   Total Protein 7.3 6.5 - 8.1 g/dL   Albumin 3.6 3.5 - 5.0 g/dL   AST 23  15 - 41 U/L   ALT 37 17 - 63 U/L   Alkaline Phosphatase 52 38 - 126 U/L   Total Bilirubin 0.6 0.3 - 1.2 mg/dL   GFR calc non Af Amer 51 (L) >60 mL/min   GFR calc Af Amer 59 (L) >60 mL/min    Comment: (NOTE) The eGFR has been calculated using the CKD EPI equation. This calculation has not been validated in all clinical situations. eGFR's persistently <60 mL/min signify possible Chronic Kidney Disease.    Anion gap 10 5 - 15  Vancomycin, random     Status: None   Collection Time: 06/19/14  8:23 PM  Result Value Ref Range   Vancomycin Rm <4 ug/mL    Comment:        Random Vancomycin therapeutic range is dependent on dosage and time of specimen collection. A peak range is 20.0-40.0 ug/mL A  trough range is 5.0-15.0 ug/mL          CBC WITH DIFFERENTIAL     Status: None   Collection Time: 06/19/14  8:23 PM  Result Value Ref Range   WBC 6.6 4.0 - 10.5 K/uL   RBC 5.17 4.22 - 5.81 MIL/uL   Hemoglobin 14.8 13.0 - 17.0 g/dL   HCT 45.6 39.0 - 52.0 %   MCV 88.2 78.0 - 100.0 fL   MCH 28.6 26.0 - 34.0 pg   MCHC 32.5 30.0 - 36.0 g/dL   RDW 12.5 11.5 - 15.5 %   Platelets 195 150 - 400 K/uL   Neutrophils Relative % 69 43 - 77 %   Neutro Abs 4.5 1.7 - 7.7 K/uL   Lymphocytes Relative 23 12 - 46 %   Lymphs Abs 1.5 0.7 - 4.0 K/uL   Monocytes Relative 6 3 - 12 %   Monocytes Absolute 0.4 0.1 - 1.0 K/uL   Eosinophils Relative 2 0 - 5 %   Eosinophils Absolute 0.2 0.0 - 0.7 K/uL   Basophils Relative 0 0 - 1 %   Basophils Absolute 0.0 0.0 - 0.1 K/uL  TSH     Status: None   Collection Time: 06/19/14  8:23 PM  Result Value Ref Range   TSH 2.308 0.350 - 4.500 uIU/mL  C-reactive protein     Status: None   Collection Time: 06/19/14  8:23 PM  Result Value Ref Range   CRP 0.5 <1.0 mg/dL  Sedimentation rate     Status: None   Collection Time: 06/19/14  8:23 PM  Result Value Ref Range   Sed Rate 15 0 - 16 mm/hr  Urinalysis, Routine w reflex microscopic     Status: Abnormal   Collection Time: 06/20/14  3:19 AM  Result Value Ref Range   Color, Urine AMBER (A) YELLOW    Comment: BIOCHEMICALS MAY BE AFFECTED BY COLOR   APPearance CLEAR CLEAR   Specific Gravity, Urine 1.020 1.005 - 1.030   pH 5.0 5.0 - 8.0   Glucose, UA NEGATIVE NEGATIVE mg/dL   Hgb urine dipstick NEGATIVE NEGATIVE   Bilirubin Urine NEGATIVE NEGATIVE   Ketones, ur NEGATIVE NEGATIVE mg/dL   Protein, ur NEGATIVE NEGATIVE mg/dL   Urobilinogen, UA 0.2 0.0 - 1.0 mg/dL   Nitrite NEGATIVE NEGATIVE   Leukocytes, UA NEGATIVE NEGATIVE    Comment: MICROSCOPIC NOT DONE ON URINES WITH NEGATIVE PROTEIN, BLOOD, LEUKOCYTES, NITRITE, OR GLUCOSE <1000 mg/dL.  Sodium, urine, random     Status: None   Collection Time: 06/20/14  3:19 AM   Result Value Ref  Range   Sodium, Ur 103 mmol/L  Creatinine, urine, random     Status: None   Collection Time: 06/20/14  3:19 AM  Result Value Ref Range   Creatinine, Urine 186.76 mg/dL   No results found.  Review of Systems  All other systems reviewed and are negative.   Blood pressure 111/62, pulse 65, temperature 98 F (36.7 C), temperature source Oral, resp. rate 18, height 5' 11"  (1.803 m), weight 90.7 kg (199 lb 15.3 oz), SpO2 97 %. Physical Exam  On examination patient has a good pulse he has no cellulitis there is clear serous drainage. Patient has just completed a course of doxycycline. Radiographs shows no destructive bony changes. C-reactive protein has decreased from 7.0 to  0.5. Sedimentation rate is 15. White blood cell count is 6.6. Initial cultures from the irrigation debridement on 4/27 are negative. Assessment/Plan Assessment: Dehiscence right lateral ankle wound status post open reduction internal fixation. Plan we will plan for removal of deep retained hardware on Friday afternoon. We will obtain tissue samples for cultures. Infectious disease has been consulted. Nephrology has been consulted  Murriel Holwerda V 06/20/2014, 6:13 AM

## 2014-06-20 NOTE — Progress Notes (Signed)
Subjective: Interval History: has no complaint just wanted to make sure no prob with AB and his kidneys.  Objective: Vital signs in last 24 hours: Temp:  [97.9 F (36.6 C)-98 F (36.7 C)] 98 F (36.7 C) (05/19 0516) Pulse Rate:  [65-66] 65 (05/19 0516) Resp:  [16-18] 18 (05/19 0516) BP: (111-131)/(62-77) 111/62 mmHg (05/19 0516) SpO2:  [97 %-98 %] 97 % (05/19 0516) Weight:  [90.7 kg (199 lb 15.3 oz)] 90.7 kg (199 lb 15.3 oz) (05/19 0300) Weight change:   Intake/Output from previous day:   Intake/Output this shift: Total I/O In: -  Out: 350 [Urine:350]  General appearance: alert, cooperative and no distress Resp: clear to auscultation bilaterally Cardio: S1, S2 normal and systolic murmur: holosystolic 2/6, blowing at apex GI: pos bs, liver down3 cm, soft Extremities: bandage R ankle calf ok,  no edema  Lab Results:  Recent Labs  06/19/14 2023 06/20/14 0551  WBC 6.6 6.5  HGB 14.8 14.2  HCT 45.6 42.2  PLT 195 181   BMET:  Recent Labs  06/19/14 2023 06/20/14 0551  NA 139 140  K 4.4 3.9  CL 103 107  CO2 26 24  GLUCOSE 100* 81  BUN 14 17  CREATININE 1.46* 1.44*  CALCIUM 9.5 8.7*   No results for input(s): PTH in the last 72 hours. Iron Studies: No results for input(s): IRON, TIBC, TRANSFERRIN, FERRITIN in the last 72 hours.  Studies/Results: No results found.  I have reviewed the patient's current medications.  Assessment/Plan: 1 CKD 2   Cr at baseline.  24h urine pending.  Discussed issues of dosing AB for renal function, optimizing levels and reassured 2 ankle infx P ID to see, 24 h urine    LOS: 1 day   Darriana Deboy L 06/20/2014,8:54 AM

## 2014-06-20 NOTE — Consult Note (Signed)
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   Comment- Would not start any anbx at this point, hopefully deep Cx will be revealing.   Thank you so much for this interesting consult,   Connor Frazier (pager) [phone] [email or web address]  Connor Frazier is an 59 y.o. male.  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 He underwent ORIF 4-22 . He was seen in office for f/u noted to have cellulitis of his ankle and on 4-27 he was adm and underwent I & D with placement of anbx beads. He was treated with anbx in hospital. His wound Cx was (-) and he was sent home with doxy (completed 5-16).  At f/u 5-18 was found to have wound dehiscence. He is afebrile and his WBC is normal.    Past Medical History  Diagnosis Date  . PONV (postoperative nausea and vomiting)   . Elevated serum creatinine   . Hypercholesterolemia     Past Surgical History  Procedure Laterality Date  . Appendectomy    . Colonoscopy    . Dental surgery      implants   . Orif ankle fracture Right 05/24/2014    Procedure: OPEN REDUCTION INTERNAL FIXATION (ORIF) ANKLE FRACTURE;  Surgeon: Newt Minion, MD;  Location: [facility];  Service: Orthopedics;  Laterality: Right;  . Irrigation and debridement abscess Right 05/29/2014    rt ankle   . I&d extremity Right 05/29/2014    Procedure: IRRIGATION AND DEBRIDEMENT RIGHT ANKLE AND PLACE ANTIBIOTIC BEADS;  Surgeon: Newt Minion, MD;  Location: [facility];  Service: Orthopedics;  Laterality: Right;     Allergies  Allergen Reactions  . Other     ANAESTHESIA MAKES PT VERY SICK ON STOMACH  . Penicillins Other  (See Comments)    Upset stomach    Medications:  Scheduled: . aspirin EC  325 mg Oral Daily    Abtx:  Anti-infectives    None      Total days of antibiotics 0          Social History:  reports that he has never smoked. He has never used smokeless tobacco. He reports that he drinks alcohol. He reports that he does not use illicit drugs.  Family History  Problem Relation Age of Onset  . Dementia Mother   . Diabetes Father   . Heart disease Father   . Cancer - Prostate Father     General ROS: no f/c, normal apetite, normal BM, normal urination, no proximal erythema, see HPI.   Blood pressure 111/62, pulse 65, temperature 98 F (36.7 C), temperature source Oral, resp. rate 18, height 5' 11"  (1.803 m), weight 90.7 kg (199 lb 15.3 oz), SpO2 97 %. General appearance: alert, cooperative and no distress Eyes: negative findings: conjunctivae and sclerae normal and pupils equal, round, reactive to light and accomodation Throat: lips, mucosa, and tongue normal; teeth and gums normal Neck: no adenopathy and supple, symmetrical, trachea midline Lungs: clear to auscultation bilaterally Heart: regular rate and rhythm Abdomen: normal findings: bowel sounds normal and soft, non-tender Extremities: edema none and 1 cm wound on R lateral  ankle. no d/c. no erythema. medial wound is closed, well helaed, no fluctuance.    Results for orders placed or performed during the hospital encounter of 06/19/14 (from the past 48 hour(s))  Comprehensive metabolic panel     Status: Abnormal   Collection Time: 06/19/14  8:23 PM  Result Value Ref Range   Sodium 139 135 - 145 mmol/L   Potassium 4.4 3.5 - 5.1 mmol/L   Chloride 103 101 - 111 mmol/L   CO2 26 22 - 32 mmol/L   Glucose, Bld 100 (H) 65 - 99 mg/dL   BUN 14 6 - 20 mg/dL   Creatinine, Ser 1.46 (H) 0.61 - 1.24 mg/dL   Calcium 9.5 8.9 - 10.3 mg/dL   Total Protein 7.3 6.5 - 8.1 g/dL   Albumin 3.6 3.5 - 5.0 g/dL   AST 23 15 - 41 U/L   ALT 37 17  - 63 U/L   Alkaline Phosphatase 52 38 - 126 U/L   Total Bilirubin 0.6 0.3 - 1.2 mg/dL   GFR calc non Af Amer 51 (L) >60 mL/min   GFR calc Af Amer 59 (L) >60 mL/min    Comment: (NOTE) The eGFR has been calculated using the CKD EPI equation. This calculation has not been validated in all clinical situations. eGFR's persistently <60 mL/min signify possible Chronic Kidney Disease.    Anion gap 10 5 - 15  Vancomycin, random     Status: None   Collection Time: 06/19/14  8:23 PM  Result Value Ref Range   Vancomycin Rm <4 ug/mL    Comment:        Random Vancomycin therapeutic range is dependent on dosage and time of specimen collection. A peak range is 20.0-40.0 ug/mL A trough range is 5.0-15.0 ug/mL          CBC WITH DIFFERENTIAL     Status: None   Collection Time: 06/19/14  8:23 PM  Result Value Ref Range   WBC 6.6 4.0 - 10.5 K/uL   RBC 5.17 4.22 - 5.81 MIL/uL   Hemoglobin 14.8 13.0 - 17.0 g/dL   HCT 45.6 39.0 - 52.0 %   MCV 88.2 78.0 - 100.0 fL   MCH 28.6 26.0 - 34.0 pg   MCHC 32.5 30.0 - 36.0 g/dL   RDW 12.5 11.5 - 15.5 %   Platelets 195 150 - 400 K/uL   Neutrophils Relative % 69 43 - 77 %   Neutro Abs 4.5 1.7 - 7.7 K/uL   Lymphocytes Relative 23 12 - 46 %   Lymphs Abs 1.5 0.7 - 4.0 K/uL   Monocytes Relative 6 3 - 12 %   Monocytes Absolute 0.4 0.1 - 1.0 K/uL   Eosinophils Relative 2 0 - 5 %   Eosinophils Absolute 0.2 0.0 - 0.7 K/uL   Basophils Relative 0 0 - 1 %   Basophils Absolute 0.0 0.0 - 0.1 K/uL  TSH     Status: None   Collection Time: 06/19/14  8:23 PM  Result Value Ref Range   TSH 2.308 0.350 - 4.500 uIU/mL  C-reactive protein     Status: None   Collection Time: 06/19/14  8:23 PM  Result Value Ref Range   CRP 0.5 <1.0 mg/dL  Sedimentation rate     Status: None   Collection Time: 06/19/14  8:23 PM  Result Value Ref Range   Sed Rate 15 0 - 16 mm/hr  Urinalysis, Routine w reflex microscopic     Status: Abnormal  Collection Time: 06/20/14  3:19 AM  Result  Value Ref Range   Color, Urine AMBER (A) YELLOW    Comment: BIOCHEMICALS MAY BE AFFECTED BY COLOR   APPearance CLEAR CLEAR   Specific Gravity, Urine 1.020 1.005 - 1.030   pH 5.0 5.0 - 8.0   Glucose, UA NEGATIVE NEGATIVE mg/dL   Hgb urine dipstick NEGATIVE NEGATIVE   Bilirubin Urine NEGATIVE NEGATIVE   Ketones, ur NEGATIVE NEGATIVE mg/dL   Protein, ur NEGATIVE NEGATIVE mg/dL   Urobilinogen, UA 0.2 0.0 - 1.0 mg/dL   Nitrite NEGATIVE NEGATIVE   Leukocytes, UA NEGATIVE NEGATIVE    Comment: MICROSCOPIC NOT DONE ON URINES WITH NEGATIVE PROTEIN, BLOOD, LEUKOCYTES, NITRITE, OR GLUCOSE <1000 mg/dL.  Sodium, urine, random     Status: None   Collection Time: 06/20/14  3:19 AM  Result Value Ref Range   Sodium, Ur 103 mmol/L  Creatinine, urine, random     Status: None   Collection Time: 06/20/14  3:19 AM  Result Value Ref Range   Creatinine, Urine 186.76 mg/dL  Renal function panel     Status: Abnormal   Collection Time: 06/20/14  5:51 AM  Result Value Ref Range   Sodium 140 135 - 145 mmol/L   Potassium 3.9 3.5 - 5.1 mmol/L   Chloride 107 101 - 111 mmol/L   CO2 24 22 - 32 mmol/L   Glucose, Bld 81 65 - 99 mg/dL   BUN 17 6 - 20 mg/dL   Creatinine, Ser 1.44 (H) 0.61 - 1.24 mg/dL   Calcium 8.7 (L) 8.9 - 10.3 mg/dL   Phosphorus 4.7 (H) 2.5 - 4.6 mg/dL   Albumin 3.0 (L) 3.5 - 5.0 g/dL   GFR calc non Af Amer 52 (L) >60 mL/min   GFR calc Af Amer >60 >60 mL/min    Comment: (NOTE) The eGFR has been calculated using the CKD EPI equation. This calculation has not been validated in all clinical situations. eGFR's persistently <60 mL/min signify possible Chronic Kidney Disease.    Anion gap 9 5 - 15  CBC with Differential     Status: None   Collection Time: 06/20/14  5:51 AM  Result Value Ref Range   WBC 6.5 4.0 - 10.5 K/uL   RBC 4.80 4.22 - 5.81 MIL/uL   Hemoglobin 14.2 13.0 - 17.0 g/dL   HCT 42.2 39.0 - 52.0 %   MCV 87.9 78.0 - 100.0 fL   MCH 29.6 26.0 - 34.0 pg   MCHC 33.6 30.0 - 36.0  g/dL   RDW 12.7 11.5 - 15.5 %   Platelets 181 150 - 400 K/uL   Neutrophils Relative % 52 43 - 77 %   Neutro Abs 3.4 1.7 - 7.7 K/uL   Lymphocytes Relative 33 12 - 46 %   Lymphs Abs 2.1 0.7 - 4.0 K/uL   Monocytes Relative 9 3 - 12 %   Monocytes Absolute 0.6 0.1 - 1.0 K/uL   Eosinophils Relative 5 0 - 5 %   Eosinophils Absolute 0.3 0.0 - 0.7 K/uL   Basophils Relative 1 0 - 1 %   Basophils Absolute 0.0 0.0 - 0.1 K/uL      Component Value Date/Time   SDES WOUND RIGHT ANKLE 05/29/2014 2020   SDES WOUND RIGHT ANKLE 05/29/2014 2020   [place] NONE 05/29/2014 2020   [facility] NONE 05/29/2014 2020   CULT  05/29/2014 2020    NO ANAEROBES ISOLATED Performed at [facility]  05/29/2014 2020  NO GROWTH 2 DAYS Performed at [facility] 06/03/2014 FINAL 05/29/2014 2020   REPTSTATUS 06/01/2014 FINAL 05/29/2014 2020   No results found. No results found for this or any previous visit (from the past 240 hour(s)).    06/20/2014, 11:36 AM     LOS: 1 day

## 2014-06-21 ENCOUNTER — Encounter (HOSPITAL_COMMUNITY): Payer: Self-pay | Admitting: Anesthesiology

## 2014-06-21 ENCOUNTER — Inpatient Hospital Stay (HOSPITAL_COMMUNITY): Payer: BLUE CROSS/BLUE SHIELD | Admitting: Anesthesiology

## 2014-06-21 ENCOUNTER — Encounter (HOSPITAL_COMMUNITY): Admission: AD | Disposition: A | Discharge status: Home Health | Payer: Self-pay | Attending: Orthopedic Surgery

## 2014-06-21 DIAGNOSIS — Y838 Other surgical procedures as the cause of abnormal reaction of the patient, or of later complication, without mention of misadventure at the time of the procedure: Secondary | ICD-10-CM

## 2014-06-21 DIAGNOSIS — B9689 Other specified bacterial agents as the cause of diseases classified elsewhere: Secondary | ICD-10-CM

## 2014-06-21 DIAGNOSIS — T8459XA Infection and inflammatory reaction due to other internal joint prosthesis, initial encounter: Secondary | ICD-10-CM

## 2014-06-21 HISTORY — PX: HARDWARE REMOVAL: SHX979

## 2014-06-21 LAB — RENAL FUNCTION PANEL
Albumin: 3 g/dL — ABNORMAL LOW (ref 3.5–5.0)
Anion gap: 7 (ref 5–15)
BUN: 11 mg/dL (ref 6–20)
CO2: 27 mmol/L (ref 22–32)
Calcium: 9.2 mg/dL (ref 8.9–10.3)
Chloride: 105 mmol/L (ref 101–111)
Creatinine, Ser: 1.48 mg/dL — ABNORMAL HIGH (ref 0.61–1.24)
GFR calc Af Amer: 58 mL/min — ABNORMAL LOW (ref >60)
GFR calc non Af Amer: 50 mL/min — ABNORMAL LOW (ref >60)
Glucose, Bld: 79 mg/dL (ref 65–99)
PHOSPHORUS: 4 mg/dL (ref 2.5–4.6)
Potassium: 4.1 mmol/L (ref 3.5–5.1)
SODIUM: 139 mmol/L (ref 135–145)

## 2014-06-21 LAB — HEPATITIS PANEL, ACUTE
HCV AB: NEGATIVE
Hep A IgM: NONREACTIVE
Hep B C IgM: NONREACTIVE
Hepatitis B Surface Ag: NEGATIVE

## 2014-06-21 LAB — PROTEIN, URINE, 24 HOUR
COLLECTION INTERVAL-UPROT: 24 h
Protein, Urine: <6 mg/dL
URINE TOTAL VOLUME-UPROT: 3100 mL

## 2014-06-21 LAB — CK: CK TOTAL: 66 U/L (ref 49–397)

## 2014-06-21 LAB — HEPATITIS C ANTIBODY (REFLEX): HCV Ab: NEGATIVE

## 2014-06-21 SURGERY — REMOVAL, HARDWARE
Anesthesia: General | Laterality: Right

## 2014-06-21 MED ORDER — FENTANYL CITRATE (PF) 100 MCG/2ML IJ SOLN
INTRAMUSCULAR | Status: AC
  Administered 2014-06-21: 100 ug via INTRAVENOUS
  Filled 2014-06-21: qty 2

## 2014-06-21 MED ORDER — 0.9 % SODIUM CHLORIDE (POUR BTL) OPTIME
TOPICAL | Status: DC | PRN
  Administered 2014-06-21: 1000 mL

## 2014-06-21 MED ORDER — PHENYLEPHRINE 40 MCG/ML (10ML) SYRINGE FOR IV PUSH (FOR BLOOD PRESSURE SUPPORT)
PREFILLED_SYRINGE | INTRAVENOUS | Status: AC
  Filled 2014-06-21: qty 10

## 2014-06-21 MED ORDER — METOCLOPRAMIDE HCL 5 MG/ML IJ SOLN: 5.0000 mg | Freq: Three times a day (TID) | INTRAMUSCULAR | Status: DC | PRN

## 2014-06-21 MED ORDER — DEXAMETHASONE SODIUM PHOSPHATE 4 MG/ML IJ SOLN
INTRAMUSCULAR | Status: DC | PRN
  Administered 2014-06-21: 4 mg via INTRAVENOUS

## 2014-06-21 MED ORDER — CEFAZOLIN SODIUM-DEXTROSE 2-3 GM-% IV SOLR
INTRAVENOUS | Status: AC
  Filled 2014-06-21: qty 50

## 2014-06-21 MED ORDER — ONDANSETRON HCL 4 MG PO TABS: 4.0000 mg | ORAL_TABLET | Freq: Four times a day (QID) | ORAL | Status: DC | PRN

## 2014-06-21 MED ORDER — MIDAZOLAM HCL 2 MG/2ML IJ SOLN
INTRAMUSCULAR | Status: AC
  Filled 2014-06-21: qty 2

## 2014-06-21 MED ORDER — PHENYLEPHRINE HCL 10 MG/ML IJ SOLN
INTRAMUSCULAR | Status: DC | PRN
  Administered 2014-06-21 (×2): 80 ug via INTRAVENOUS

## 2014-06-21 MED ORDER — FENTANYL CITRATE (PF) 100 MCG/2ML IJ SOLN
INTRAMUSCULAR | Status: DC | PRN
  Administered 2014-06-21 (×2): 50 ug via INTRAVENOUS

## 2014-06-21 MED ORDER — PROMETHAZINE HCL 25 MG/ML IJ SOLN: 6.2500 mg | INTRAMUSCULAR | Status: DC | PRN

## 2014-06-21 MED ORDER — DAPTOMYCIN 500 MG IV SOLR
600.0000 mg | INTRAVENOUS | Status: DC
  Administered 2014-06-21 – 2014-06-27 (×7): 600 mg via INTRAVENOUS
  Filled 2014-06-21 (×12): qty 12

## 2014-06-21 MED ORDER — OXYCODONE HCL 5 MG PO TABS
5.0000 mg | ORAL_TABLET | ORAL | Status: DC | PRN
  Administered 2014-06-22: 5 mg via ORAL
  Administered 2014-06-24 – 2014-06-25 (×4): 10 mg via ORAL
  Filled 2014-06-21 (×4): qty 2
  Filled 2014-06-21: qty 1
  Filled 2014-06-21: qty 2

## 2014-06-21 MED ORDER — HYDROMORPHONE HCL 1 MG/ML IJ SOLN: 1.0000 mg | INTRAMUSCULAR | Status: DC | PRN

## 2014-06-21 MED ORDER — LACTATED RINGERS IV SOLN
INTRAVENOUS | Status: DC | PRN
  Administered 2014-06-21: 15:00:00 via INTRAVENOUS

## 2014-06-21 MED ORDER — ACETAMINOPHEN 650 MG RE SUPP: 650.0000 mg | Freq: Four times a day (QID) | RECTAL | Status: DC | PRN

## 2014-06-21 MED ORDER — CEFTRIAXONE SODIUM IN DEXTROSE 40 MG/ML IV SOLN
2.0000 g | INTRAVENOUS | Status: DC
  Administered 2014-06-21 – 2014-06-27 (×7): 2 g via INTRAVENOUS
  Filled 2014-06-21 (×8): qty 50

## 2014-06-21 MED ORDER — ONDANSETRON HCL 4 MG/2ML IJ SOLN: 4.0000 mg | Freq: Four times a day (QID) | INTRAMUSCULAR | Status: DC | PRN

## 2014-06-21 MED ORDER — MIDAZOLAM HCL 2 MG/2ML IJ SOLN
INTRAMUSCULAR | Status: AC
  Administered 2014-06-21: 2 mg via INTRAVENOUS
  Filled 2014-06-21: qty 2

## 2014-06-21 MED ORDER — GENTAMICIN SULFATE 40 MG/ML IJ SOLN
INTRAMUSCULAR | Status: AC
  Filled 2014-06-21: qty 2

## 2014-06-21 MED ORDER — CEFAZOLIN SODIUM 1-5 GM-% IV SOLN
1.0000 g | Freq: Four times a day (QID) | INTRAVENOUS | Status: DC
  Filled 2014-06-21 (×3): qty 50

## 2014-06-21 MED ORDER — GENTAMICIN SULFATE 40 MG/ML IJ SOLN
INTRAMUSCULAR | Status: DC | PRN
  Administered 2014-06-21: 120 mg via INTRAMUSCULAR

## 2014-06-21 MED ORDER — VANCOMYCIN HCL 500 MG IV SOLR
INTRAVENOUS | Status: DC | PRN
  Administered 2014-06-21: 500 mg

## 2014-06-21 MED ORDER — SODIUM CHLORIDE 0.9 % IV SOLN: INTRAVENOUS | Status: DC

## 2014-06-21 MED ORDER — PROPOFOL 10 MG/ML IV BOLUS
INTRAVENOUS | Status: AC
  Filled 2014-06-21: qty 20

## 2014-06-21 MED ORDER — LACTATED RINGERS IV SOLN
INTRAVENOUS | Status: DC
  Administered 2014-06-21: 14:00:00 via INTRAVENOUS

## 2014-06-21 MED ORDER — METOCLOPRAMIDE HCL 5 MG PO TABS: 5.0000 mg | ORAL_TABLET | Freq: Three times a day (TID) | ORAL | Status: DC | PRN

## 2014-06-21 MED ORDER — PROPOFOL 10 MG/ML IV BOLUS
INTRAVENOUS | Status: DC | PRN
  Administered 2014-06-21: 200 mg via INTRAVENOUS

## 2014-06-21 MED ORDER — METHOCARBAMOL 1000 MG/10ML IJ SOLN
500.0000 mg | Freq: Four times a day (QID) | INTRAVENOUS | Status: DC | PRN
  Filled 2014-06-21: qty 5

## 2014-06-21 MED ORDER — DEXAMETHASONE SODIUM PHOSPHATE 4 MG/ML IJ SOLN
INTRAMUSCULAR | Status: AC
  Filled 2014-06-21: qty 1

## 2014-06-21 MED ORDER — FENTANYL CITRATE (PF) 250 MCG/5ML IJ SOLN
INTRAMUSCULAR | Status: AC
  Filled 2014-06-21: qty 5

## 2014-06-21 MED ORDER — ONDANSETRON HCL 4 MG/2ML IJ SOLN
INTRAMUSCULAR | Status: DC | PRN
  Administered 2014-06-21: 4 mg via INTRAVENOUS

## 2014-06-21 MED ORDER — METHOCARBAMOL 500 MG PO TABS
500.0000 mg | ORAL_TABLET | Freq: Four times a day (QID) | ORAL | Status: DC | PRN
  Filled 2014-06-21: qty 1

## 2014-06-21 MED ORDER — ACETAMINOPHEN 325 MG PO TABS
650.0000 mg | ORAL_TABLET | Freq: Four times a day (QID) | ORAL | Status: DC | PRN
  Administered 2014-06-22: 650 mg via ORAL
  Filled 2014-06-21: qty 2

## 2014-06-21 MED ORDER — VANCOMYCIN HCL 500 MG IV SOLR
INTRAVENOUS | Status: AC
  Filled 2014-06-21: qty 500

## 2014-06-21 MED ORDER — MIDAZOLAM HCL 2 MG/2ML IJ SOLN
1.0000 mg | INTRAMUSCULAR | Status: DC | PRN
Start: 2014-06-21 — End: 2014-06-21
  Administered 2014-06-21: 2 mg via INTRAVENOUS
  Filled 2014-06-21: qty 2

## 2014-06-21 MED ORDER — CEFAZOLIN SODIUM-DEXTROSE 2-3 GM-% IV SOLR
INTRAVENOUS | Status: DC | PRN
  Administered 2014-06-21: 2 g via INTRAVENOUS

## 2014-06-21 MED ORDER — FENTANYL CITRATE (PF) 100 MCG/2ML IJ SOLN
50.0000 ug | INTRAMUSCULAR | Status: DC | PRN
  Administered 2014-06-21: 100 ug via INTRAVENOUS
  Filled 2014-06-21: qty 2

## 2014-06-21 MED ORDER — HYDROMORPHONE HCL 1 MG/ML IJ SOLN: 0.2500 mg | INTRAMUSCULAR | Status: DC | PRN

## 2014-06-21 SURGICAL SUPPLY — 43 items
BANDAGE ESMARK 6X9 LF (GAUZE/BANDAGES/DRESSINGS) IMPLANT
BNDG COHESIVE 4X5 TAN STRL (GAUZE/BANDAGES/DRESSINGS) IMPLANT
BNDG ESMARK 6X9 LF (GAUZE/BANDAGES/DRESSINGS)
BNDG GAUZE ELAST 4 BULKY (GAUZE/BANDAGES/DRESSINGS) ×2 IMPLANT
COVER SURGICAL LIGHT HANDLE (MISCELLANEOUS) ×4 IMPLANT
CUFF TOURNIQUET SINGLE 34IN LL (TOURNIQUET CUFF) IMPLANT
CUFF TOURNIQUET SINGLE 44IN (TOURNIQUET CUFF) IMPLANT
DRAPE C-ARM 42X72 X-RAY (DRAPES) IMPLANT
DRAPE INCISE IOBAN 66X45 STRL (DRAPES) IMPLANT
DRAPE ORTHO SPLIT 77X108 STRL (DRAPES)
DRAPE SURG ORHT 6 SPLT 77X108 (DRAPES) IMPLANT
DRAPE U-SHAPE 47X51 STRL (DRAPES) ×2 IMPLANT
DRSG EMULSION OIL 3X3 NADH (GAUZE/BANDAGES/DRESSINGS) ×2 IMPLANT
DRSG MEPITEL 4X7.2 (GAUZE/BANDAGES/DRESSINGS) ×2 IMPLANT
DRSG PAD ABDOMINAL 8X10 ST (GAUZE/BANDAGES/DRESSINGS) ×2 IMPLANT
DURAPREP 26ML APPLICATOR (WOUND CARE) ×2 IMPLANT
ELECT REM PT RETURN 9FT ADLT (ELECTROSURGICAL) ×2
ELECTRODE REM PT RTRN 9FT ADLT (ELECTROSURGICAL) ×1 IMPLANT
GAUZE SPONGE 4X4 12PLY STRL (GAUZE/BANDAGES/DRESSINGS) ×2 IMPLANT
GLOVE BIOGEL PI IND STRL 9 (GLOVE) ×1 IMPLANT
GLOVE BIOGEL PI INDICATOR 9 (GLOVE) ×1
GLOVE SURG ORTHO 9.0 STRL STRW (GLOVE) ×2 IMPLANT
GOWN STRL REUS W/ TWL XL LVL3 (GOWN DISPOSABLE) ×3 IMPLANT
GOWN STRL REUS W/TWL XL LVL3 (GOWN DISPOSABLE) ×3
KIT BASIN OR (CUSTOM PROCEDURE TRAY) ×2 IMPLANT
KIT ROOM TURNOVER OR (KITS) ×2 IMPLANT
KIT STIMULAN RAPID CURE 5CC (Orthopedic Implant) ×2 IMPLANT
MANIFOLD NEPTUNE II (INSTRUMENTS) ×2 IMPLANT
NS IRRIG 1000ML POUR BTL (IV SOLUTION) ×2 IMPLANT
PACK ORTHO EXTREMITY (CUSTOM PROCEDURE TRAY) ×2 IMPLANT
PAD ARMBOARD 7.5X6 YLW CONV (MISCELLANEOUS) ×4 IMPLANT
SPONGE LAP 18X18 X RAY DECT (DISPOSABLE) IMPLANT
STAPLER VISISTAT 35W (STAPLE) IMPLANT
STOCKINETTE IMPERVIOUS 9X36 MD (GAUZE/BANDAGES/DRESSINGS) IMPLANT
SUT ETHILON 2 0 PSLX (SUTURE) IMPLANT
SUT VIC AB 0 CT1 27 (SUTURE)
SUT VIC AB 0 CT1 27XBRD ANBCTR (SUTURE) IMPLANT
SUT VIC AB 2-0 CT1 27 (SUTURE)
SUT VIC AB 2-0 CT1 TAPERPNT 27 (SUTURE) IMPLANT
TOWEL OR 17X24 6PK STRL BLUE (TOWEL DISPOSABLE) ×2 IMPLANT
TOWEL OR 17X26 10 PK STRL BLUE (TOWEL DISPOSABLE) ×2 IMPLANT
UNDERPAD 30X30 INCONTINENT (UNDERPADS AND DIAPERS) ×2 IMPLANT
WATER STERILE IRR 1000ML POUR (IV SOLUTION) ×2 IMPLANT

## 2014-06-21 NOTE — Progress Notes (Signed)
ANTIBIOTIC CONSULT NOTE - INITIAL  Pharmacy Consult for daptomycin  Indication: osteomyelitis   Allergies  Allergen Reactions  . Other     ANAESTHESIA MAKES PT VERY SICK ON STOMACH  . Penicillins Other (See Comments)    Upset stomach    Patient Measurements: Height: 5\' 11"  (180.3 cm) Weight: 199 lb 15.3 oz (90.7 kg) IBW/kg (Calculated) : 75.3 Adjusted Body Weight:   Vital Signs: Temp: 98 F (36.7 C) (05/20 1616) BP: 126/78 mmHg (05/20 1616) Pulse Rate: 81 (05/20 1616) Intake/Output from previous day: 05/19 0701 - 05/20 0700 In: 360 [P.O.:360] Out: 2200 [Urine:2200] Intake/Output from this shift: Total I/O In: 800 [I.V.:800] Out: 50 [Blood:50]  Labs:  Recent Labs  06/19/14 2023 06/20/14 0319 06/20/14 0551 06/21/14 0430  WBC 6.6  --  6.5  --   HGB 14.8  --  14.2  --   PLT 195  --  181  --   LABCREA  --  186.76  --   --   CREATININE 1.46*  --  1.44* 1.48*   Estimated Creatinine Clearance: 62.7 mL/min (by C-G formula based on Cr of 1.48).  Recent Labs  06[facility]19/14 2023  [facility]    Medical History: Past Medical History  Diagnosis Date  . PONV (postoperative nausea and vomiting)   . Elevated serum creatinine   . Hypercholesterolemia     Medications:  Prescriptions prior to admission  Medication Sig Dispense Refill Last Dose  . aspirin EC 325 MG tablet Take 1 tablet (325 mg total) by mouth daily. 30 tablet 0 06/18/2014 at Unknown time  . HYDROcodone-acetaminophen (NORCO/VICODIN) 5-325 MG per tablet Take 1 tablet by mouth every 4 (four) hours as needed for moderate pain.   Past Week at Unknown time  . Multiple Vitamins-Minerals (MULTIVITAMIN WITH MINERALS) tablet Take 1 tablet by mouth daily.   06/18/2014 at Unknown time  . ondansetron (ZOFRAN) 4 MG tablet Take 4 mg by mouth every 8 (eight) hours as needed for nausea or vomiting.   Past Month at Unknown time  . Probiotic Product (PROBIOTIC DAILY PO) Take 1 tablet by mouth daily.   06/18/2014 at Unknown  time  . Vitamin D, Ergocalciferol, (DRISDOL) 50000 UNITS CAPS capsule Take 50,000 Units by mouth every 7 (seven) days. 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 Pt went to OR today for placement of antibiotic beads, hardware removal and wound vac. WBC ok, afebrile, BP soft, HR ok, Scr 1.48 (CKD stage III).  Goal of Therapy:  Resolution of infection  Plan:  Daptomycin 600 mg IV q24h Ceftriaxone 2 g IV q24h F/u cultures from OR today CK q Mondays  Agapito GamesAlison Kathan Kirker, PharmD, BCPS Clinical Pharmacist Pager: [phone] 06/21/2014 5:13 PM

## 2014-06-21 NOTE — Progress Notes (Signed)
   INFECTIOUS DISEASE PROGRESS NOTE  ID: Connor Frazier is a 59 y.o. male with  Active Problems:   Hardware complicating wound infection  Subjective: Pt is post-op, "groggy"  Abtx:  Anti-infectives    None      Medications:  Scheduled: .  ceFAZolin (ANCEF) IV  1 g Intravenous Q6H    Objective: Vital signs in last 24 hours: Temp:  [97.7 F (36.5 C)-97.9 F (36.6 C)] 97.7 F (36.5 C) (05/20 0503) Pulse Rate:  [54-63] 58 (05/20 0503) Resp:  [18] 18 (05/20 0503) BP: (109-135)/(66-79) 113/66 mmHg (05/20 0503) SpO2:  [93 %-97 %] 93 % (05/20 0503)   General appearance: alert, fatigued and no distress Extremities: L ankle with VAC in place.   Lab Results  Recent Labs  06/19/14 2023 06/20/14 0551 06/21/14 0430  WBC 6.6 6.5  --   HGB 14.8 14.2  --   HCT 45.6 42.2  --   NA 139 140 139  K 4.4 3.9 4.1  CL 103 107 105  CO2 26 24 27   BUN 14 17 11   CREATININE 1.46* 1.44* 1.48*   Liver Panel  Recent Labs  06/19/14 2023 06/20/14 0551 06/21/14 0430  PROT 7.3  --   --   ALBUMIN 3.6 3.0* 3.0*  AST 23  --   --   ALT 37  --   --   ALKPHOS 52  --   --   BILITOT 0.6  --   --    Sedimentation Rate  Recent Labs  06/20/14 1358  ESRSEDRATE 10   C-Reactive Protein  Recent Labs  06/19/14 2023 06/20/14 1358  CRP 0.5 <0.5    Microbiology: No results found for this or any previous visit (from the past 240 hour(s)).  Studies/Results: [place]  06/20/2014   CLINICAL DATA:  Acute kidney injury.  EXAM: RENAL / URINARY TRACT ULTRASOUND COMPLETE  COMPARISON:  None.  FINDINGS: Right Kidney:  Length: 10.7 cm. Echogenicity within normal limits. No mass or hydronephrosis visualized.  Left Kidney:  Length: 11.3 cm. Echogenicity within normal limits. No mass or hydronephrosis visualized.  Bladder:  Appears normal for degree of bladder distention. Prostate gland appears mildly enlarged measuring 4.2 x 3.8 x 6.0 cm.  IMPRESSION: 1. Normal appearance of the kidneys and  bladder. 2. Prostate gland appears mildly enlarged.   Electronically Signed   By: Norva PavlovElizabeth  Brown M.D.   On: 06/20/2014 13:13     Assessment/Plan: Septic arthritis R ankle (prosthetic) CKD stage 3a  Total days of antibiotics: 0  Ancef  Will start pt on ceftriaonxe, cubicin Await his OR Cx from today.  Wife very frustrated this his Cx have been negative. Spoke with her and pt at length.  ID will check over w/e.          Johny SaxJeffrey Tilden Broz Infectious Diseases (pager) [phone] [email or web address] 06/21/2014, 2:38 PM  LOS: 2 days

## 2014-06-21 NOTE — H&P (View-Only) (Signed)
Patient ID: Connor Frazier, male   DOB: [birth year], 59 y.o.   MRN: [id number] Plan for irrigation and debridement of infected right ankle internal fixation. We will obtain tissue cultures and then will start Kefzol after cultures obtained. We will change antibiotics according to recommendations from infectious disease.

## 2014-06-21 NOTE — Progress Notes (Signed)
Patient ID: Connor Frazier, male   DOB: 07/24/1955, 58 y.o.   MRN: [id number] Plan for irrigation and debridement of infected right ankle internal fixation. We will obtain tissue cultures and then will start Kefzol after cultures obtained. We will change antibiotics according to recommendations from infectious disease.  

## 2014-06-21 NOTE — Progress Notes (Signed)
Orthopedic Tech Progress Note Patient Details:  Connor LeaderSebastian Frazier Aug 03, 1955 [id number]  Ortho Devices Type of Ortho Device: CAM walker Ortho Device/Splint Location: rle Ortho Device/Splint Interventions: Application   Adrean Heitz 06/21/2014, 5:08 PM

## 2014-06-21 NOTE — Transfer of Care (Signed)
Immediate Anesthesia Transfer of Care Note  Patient: Connor Frazier  Procedure(s) Performed: Procedure(s): Removal Hardware Right Ankle, Place Antibiotic Beads, and Wound VAC (Right)  Patient Location: PACU  Anesthesia Type:General  Level of Consciousness: awake, alert  and oriented  Airway & Oxygen Therapy: Patient Spontanous Breathing and Patient connected to nasal cannula oxygen  Post-op Assessment: Report given to RN and Post -op Vital signs reviewed and stable  Post vital signs: Reviewed and stable  Last Vitals:  Filed Vitals:   06/21/14 1500  BP: 108/53  Pulse: 67  Temp:   Resp: 15    Complications: No apparent anesthesia complications

## 2014-06-21 NOTE — Progress Notes (Signed)
Subjective: Interval History: has complaints concern about AB, the beads and the kidneys.  Objective: Vital signs in last 24 hours: Temp:  [97.7 F (36.5 C)-97.9 F (36.6 C)] 97.7 F (36.5 C) (05/20 0503) Pulse Rate:  [54-63] 58 (05/20 0503) Resp:  [18] 18 (05/20 0503) BP: (109-135)/(66-79) 113/66 mmHg (05/20 0503) SpO2:  [93 %-97 %] 93 % (05/20 0503) Weight change:   Intake/Output from previous day: 05/19 0701 - 05/20 0700 In: 360 [P.O.:360] Out: 2200 [Urine:2200] Intake/Output this shift:    General appearance: alert, cooperative and no distress Resp: clear to auscultation bilaterally Cardio: S1, S2 normal and systolic murmur: holosystolic 2/6, blowing at apex GI: soft,pos bs, liver down 4 cm,  Extremities: bandage R ankle, no edema  Lab Results:  Recent Labs  06/19/14 2023 06/20/14 0551  WBC 6.6 6.5  HGB 14.8 14.2  HCT 45.6 42.2  PLT 195 181   BMET:  Recent Labs  06/20/14 0551 06/21/14 0430  NA 140 139  K 3.9 4.1  CL 107 105  CO2 24 27  GLUCOSE 81 79  BUN 17 11  CREATININE 1.44* 1.48*  CALCIUM 8.7* 9.2   No results for input(s): PTH in the last 72 hours. Iron Studies: No results for input(s): IRON, TIBC, TRANSFERRIN, FERRITIN in the last 72 hours.  Studies/Results: [place] Renal  06/20/2014   CLINICAL DATA:  Acute kidney injury.  EXAM: RENAL / URINARY TRACT ULTRASOUND COMPLETE  COMPARISON:  None.  FINDINGS: Right Kidney:  Length: 10.7 cm. Echogenicity within normal limits. No mass or hydronephrosis visualized.  Left Kidney:  Length: 11.3 cm. Echogenicity within normal limits. No mass or hydronephrosis visualized.  Bladder:  Appears normal for degree of bladder distention. Prostate gland appears mildly enlarged measuring 4.2 x 3.8 x 6.0 cm.  IMPRESSION: 1. Normal appearance of the kidneys and bladder. 2. Prostate gland appears mildly enlarged.   Electronically Signed   By: Norva PavlovElizabeth  Brown M.D.   On: 06/20/2014 13:13    I have reviewed the patient's current  medications.  Assessment/Plan: 1  CKD 3 stable 2 Ankle infx P not adding a lot , the patient desires F/U a couple more days and review of meds.    LOS: 2 days   Areanna Gengler L 06/21/2014,10:01 AM

## 2014-06-21 NOTE — Op Note (Signed)
06/19/2014 - 06/21/2014  6:11 PM  PATIENT:  Connor Frazier    PRE-OPERATIVE DIAGNOSIS:  Infected Right Ankle Hardware  POST-OPERATIVE DIAGNOSIS:  Same  PROCEDURE:  Removal Hardware Right Ankle,  Place Antibiotic Beads,  placement Wound VAC Excision of skin soft tissue and muscle with sharp debridement right ankle. Cultures obtained prior to antibiotics  SURGEON:  Nadara MustardUDA,MARCUS V, MD  PHYSICIAN ASSISTANT:None ANESTHESIA:   General  PREOPERATIVE INDICATIONS:  Connor Frazier is a  59 y.o. male with a diagnosis of Infected Right Ankle Hardware who failed conservative measures and elected for surgical management.    The risks benefits and alternatives were discussed with the patient preoperatively including but not limited to the risks of infection, bleeding, nerve injury, cardiopulmonary complications, the need for revision surgery, among others, and the patient was willing to proceed.  OPERATIVE IMPLANTS: Antibiotic beads with 500 mg vancomycin and 160 mg gentamicin  OPERATIVE FINDINGS: Ischemic muscle from the severe deformity of his ankle fracture dislocation. This ischemic muscle is most likely the cause of the bacterial load. The bone was grossly stable but patient will need to be nonweightbearing.  OPERATIVE PROCEDURE: Patient was brought to the operating room and underwent a general anesthetic. After adequate levels anesthesia obtained patient's right lower extremity was prepped using DuraPrep draped into a sterile field. A timeout was called. An elliptical incision was made around the dehisced wound. The hardware was removed with all screws and plate removed. Patient had ischemic muscle anteriorly this was removed sharply there is also ischemic muscle posteriorly this was also sharply excised. The bone was debrided the wound was irrigated with pulsatile lavage with 3 L. The tissue was healthy and viable after debridement. 5 mL of antibiotic beads were placed. The skin was  closed using 2-0 nylon with local tissue rearrangement to close the excised tissue. A wound VAC was placed. Patient was discharged to PACU in stable condition. Plan for final antibiotics as per infectious disease. Current antibiotics Kefzol.

## 2014-06-21 NOTE — Anesthesia Preprocedure Evaluation (Signed)
Anesthesia Evaluation  Patient identified by MRN, date of birth, ID band Patient awake    Reviewed: Allergy & Precautions, NPO status , Patient's Chart, lab work & pertinent test results  History of Anesthesia Complications (+) PONV and history of anesthetic complications  Airway Mallampati: II  TM Distance: >3 FB Neck ROM: Full    Dental  (+) Teeth Intact, Dental Advisory Given   Pulmonary neg pulmonary ROS,    Pulmonary exam normal       Cardiovascular negative cardio ROS Normal cardiovascular exam    Neuro/Psych negative neurological ROS  negative psych ROS   GI/Hepatic negative GI ROS, Neg liver ROS,   Endo/Other  negative endocrine ROS  Renal/GU Renal InsufficiencyRenal disease     Musculoskeletal   Abdominal   Peds  Hematology   Anesthesia Other Findings   Reproductive/Obstetrics                             Anesthesia Physical Anesthesia Plan  ASA: II  Anesthesia Plan: General   Post-op Pain Management:    Induction: Intravenous  Airway Management Planned: LMA  Additional Equipment:   Intra-op Plan:   Post-operative Plan: Extubation in OR  Informed Consent: I have reviewed the patients History and Physical, chart, labs and discussed the procedure including the risks, benefits and alternatives for the proposed anesthesia with the patient or authorized representative who has indicated his/her understanding and acceptance.   Dental advisory given  Plan Discussed with: CRNA, Anesthesiologist and Surgeon  Anesthesia Plan Comments:         Anesthesia Quick Evaluation

## 2014-06-21 NOTE — Interval H&P Note (Signed)
History and Physical Interval Note:  06/21/2014 6:40 AM  Connor Frazier  has presented today for surgery, with the diagnosis of Infected Right Ankle Hardware  The various methods of treatment have been discussed with the patient and family. After consideration of risks, benefits and other options for treatment, the patient has consented to  Procedure(s): Removal Hardware Right Ankle, Place Antibiotic Beads, and Wound VAC (Right) as a surgical intervention .  The patient's history has been reviewed, patient examined, no change in status, stable for surgery.  I have reviewed the patient's chart and labs.  Questions were answered to the patient's satisfaction.     DUDA,MARCUS V

## 2014-06-21 NOTE — Anesthesia Procedure Notes (Signed)
Procedure Name: LMA Insertion Date/Time: 06/21/2014 3:15 PM Performed by: Leonel Ramsay'LAUGHLIN, Allah Reason H Pre-anesthesia Checklist: Timeout performed, Patient identified, Emergency Drugs available, Suction available and Patient being monitored Patient Re-evaluated:Patient Re-evaluated prior to inductionOxygen Delivery Method: Circle system utilized Preoxygenation: Pre-oxygenation with 100% oxygen Intubation Type: IV induction LMA: LMA inserted LMA Size: 5.0 Number of attempts: 1 Placement Confirmation: positive ETCO2 and breath sounds checked- equal and bilateral Tube secured with: Tape Dental Injury: Teeth and Oropharynx as per pre-operative assessment

## 2014-06-22 ENCOUNTER — Inpatient Hospital Stay (HOSPITAL_COMMUNITY): Payer: BLUE CROSS/BLUE SHIELD

## 2014-06-22 DIAGNOSIS — N189 Chronic kidney disease, unspecified: Secondary | ICD-10-CM | POA: Insufficient documentation

## 2014-06-22 DIAGNOSIS — T847XXD Infection and inflammatory reaction due to other internal orthopedic prosthetic devices, implants and grafts, subsequent encounter: Secondary | ICD-10-CM

## 2014-06-22 LAB — RENAL FUNCTION PANEL
ALBUMIN: 3.4 g/dL — AB (ref 3.5–5.0)
ANION GAP: 10 (ref 5–15)
BUN: 16 mg/dL (ref 6–20)
CO2: 23 mmol/L (ref 22–32)
Calcium: 9.1 mg/dL (ref 8.9–10.3)
Chloride: 103 mmol/L (ref 101–111)
Creatinine, Ser: 1.46 mg/dL — ABNORMAL HIGH (ref 0.61–1.24)
GFR calc non Af Amer: 51 mL/min — ABNORMAL LOW (ref >60)
GFR, EST AFRICAN AMERICAN: 59 mL/min — AB (ref >60)
Glucose, Bld: 93 mg/dL (ref 65–99)
PHOSPHORUS: 3.4 mg/dL (ref 2.5–4.6)
Potassium: 4 mmol/L (ref 3.5–5.1)
SODIUM: 136 mmol/L (ref 135–145)

## 2014-06-22 MED ORDER — GADOBENATE DIMEGLUMINE 529 MG/ML IV SOLN: 20.0000 mL | Freq: Once | INTRAVENOUS | Status: AC | PRN

## 2014-06-22 NOTE — Progress Notes (Signed)
Patient stable pain controlled Wound VAC on right ankle intact his block is still in effect Patient has some plantar flexion but no dorsiflexion yet due to the block compartments otherwise soft Foot perfused Cultures pending Patient on Cubicin and ceftriaxone Infectious disease to reevaluate this weekend

## 2014-06-22 NOTE — Clinical Social Work Note (Signed)
Clinical Social Worker received standing order referral for possible ST-SNF placement.  Chart reviewed.  PT/OT recommending no follow up at this time.  CSW signing off - please re consult if social work needs arise.  Jesse Clemie General, LCSW [phone] 

## 2014-06-22 NOTE — Progress Notes (Signed)
[facility]     NOTE 2nd note written, last one lost in EPIC   Subjective:     Antibiotics:  Anti-infectives    Start     Dose/Rate Route Frequency Ordered Stop   06/21/14 1800  DAPTOmycin (CUBICIN) 600 mg in sodium chloride 0.9 % IVPB     600 mg 224 mL/hr over 30 Minutes Intravenous Every 24 hours 06/21/14 1735     06/21/14 1730  cefTRIAXone (ROCEPHIN) 2 g in dextrose 5 % 50 mL IVPB - Premix     2 g 100 mL/hr over 30 Minutes Intravenous Every 24 hours 06/21/14 1646     06/21/14 1645  ceFAZolin (ANCEF) IVPB 1 g/50 mL premix  Status:  Discontinued     1 g 100 mL/hr over 30 Minutes Intravenous Every 6 hours 06/21/14 1633 06/21/14 1646   06/21/14 1551  vancomycin (VANCOCIN) powder  Status:  Discontinued       As needed 06/21/14 1551 06/21/14 1603   06/21/14 1530  gentamicin (GARAMYCIN) injection  Status:  Discontinued       As needed 06/21/14 1551 06/21/14 1603      Medications: Scheduled Meds: . cefTRIAXone (ROCEPHIN)  IV  2 g Intravenous Q24H  . DAPTOmycin (CUBICIN)  IV  600 mg Intravenous Q24H   Continuous Infusions: . sodium chloride     PRN Meds:.acetaminophen **OR** acetaminophen, HYDROmorphone (DILAUDID) injection, HYDROmorphone (DILAUDID) injection, methocarbamol **OR** methocarbamol (ROBAXIN)  IV, metoCLOPramide **OR** metoCLOPramide (REGLAN) injection, ondansetron **OR** ondansetron (ZOFRAN) IV, oxyCODONE, promethazine    Objective: Weight change:   Intake/Output Summary (Last 24 hours) at 06/22/14 1823 Last data filed at 06/22/14 1759  Gross per 24 hour  Intake   1312 ml  Output   2425 ml  Net  -1113 ml   Blood pressure 116/67, pulse 82, temperature 98.7 F (37.1 C), temperature source Oral, resp. rate 16, height  (1.803 m), weight 199 lb 15.3 oz (90.7 kg), SpO2 97 %. Temp:  [97.7 F (36.5 C)-98.7 F (37.1 C)] 98.7 F (37.1 C) (05/21 1457) Pulse Rate:  [65-82] 82 (05/21 1457) Resp:  [14-16] 16 (05/21 1457) BP:  (111-118)/(64-71) 116/67 mmHg (05/21 1457) SpO2:  [96 %-98 %] 97 % (05/21 1457)  Physical Exam: General: Alert and awake, oriented x3, not in any acute distress. HEENT: anicteric sclera, EOMI CVS regular rate, normal r,  no murmur rubs or gallops Chest: clear to auscultation bilaterally, no wheezing, rales or rhonchi Abdomen: soft nontender, nondistended, normal bowel sounds, Extremities:   06/22/2014:         Neuro: nonfocal  CBC Latest Ref Rng 06/20/2014 06/19/2014 06/01/2014  WBC 4.0 - 10.5 K/uL 6.5 6.6 11.1(H)  Hemoglobin 13.0 - 17.0 g/dL 40.9 81.1 91.4  Hematocrit 39.0 - 52.0 % 42.2 45.6 41.1  Platelets 150 - 400 K/uL 181 195 246     BMET  Recent Labs  06/21/14 0430 06/22/14 1230  NA 139 136  K 4.1 4.0  CL 105 103  CO2 27 23  GLUCOSE 79 93  BUN 11 16  CREATININE 1.48* 1.46*  CALCIUM 9.2 9.1     Liver Panel   Recent Labs  06/19/14 2023  06/21/14 0430 06/22/14 1230  PROT 7.3  --   --   --   ALBUMIN 3.6  < > 3.0* 3.4*  AST 23  --   --   --   ALT 37  --   --   --   ALKPHOS 52  --   --   --  BILITOT 0.6  --   --   --   < > = values in this interval not displayed.     Sedimentation Rate  Recent Labs  06/20/14 1358  ESRSEDRATE 10   C-Reactive Protein  Recent Labs  06/19/14 2023 06/20/14 1358  CRP 0.5 <0.5    Micro Results: Recent Results (from the past 720 hour(s))  Surgical pcr screen     Status: None   Collection Time: 05/24/14 10:36 AM  Result Value Ref Range Status   MRSA, PCR NEGATIVE NEGATIVE Final   Staphylococcus aureus NEGATIVE NEGATIVE Final    Comment:        The Xpert SA Assay (FDA approved for NASAL specimens in patients over 58 years of age), is one component of a comprehensive surveillance program.  Test performance has been validated by [facility] for patients greater than or equal to 81 year old. It is not intended to diagnose infection nor to guide or monitor treatment.   Anaerobic culture     Status:  None   Collection Time: 05/29/14  8:20 PM  Result Value Ref Range Status   Specimen Description WOUND RIGHT ANKLE  Final   Special Requests NONE  Final   Gram Stain   Final    RARE WBC PRESENT, PREDOMINANTLY PMN NO SQUAMOUS EPITHELIAL CELLS SEEN NO ORGANISMS SEEN Performed at [facility]    Culture   Final    NO ANAEROBES ISOLATED Performed at [facility]    Report Status 06/03/2014 FINAL  Final  Wound culture     Status: None   Collection Time: 05/29/14  8:20 PM  Result Value Ref Range Status   Specimen Description WOUND RIGHT ANKLE  Final   Special Requests NONE  Final   Gram Stain   Final    RARE WBC PRESENT, PREDOMINANTLY PMN NO SQUAMOUS EPITHELIAL CELLS SEEN NO ORGANISMS SEEN Performed at [facility]    Culture   Final    NO GROWTH 2 DAYS Performed at [facility]    Report Status 06/01/2014 FINAL  Final  Anaerobic culture     Status: None (Preliminary result)   Collection Time: 06/21/14  3:32 PM  Result Value Ref Range Status   Specimen Description WOUND RIGHT ANKLE  Final   Special Requests NONE  Final   Gram Stain   Final    FEW WBC PRESENT, PREDOMINANTLY MONONUCLEAR NO ORGANISMS SEEN Performed at [facility]    Culture   Final    NO ANAEROBES ISOLATED; CULTURE IN PROGRESS FOR 5 DAYS Performed at [facility]    Report Status PENDING  Incomplete  Fungus Culture with Smear     Status: None (Preliminary result)   Collection Time: 06/21/14  3:32 PM  Result Value Ref Range Status   Specimen Description WOUND RIGHT ANKLE  Final   Special Requests NONE  Final   Fungal Smear   Final    NO YEAST OR FUNGAL ELEMENTS SEEN Performed at [facility]    Culture   Final    CULTURE IN PROGRESS FOR FOUR WEEKS Performed at [facility]    Report Status PENDING  Incomplete  Tissue culture     Status: None (Preliminary result)   Collection Time: 06/21/14  3:32 PM  Result Value Ref Range Status    Specimen Description WOUND RIGHT ANKLE  Final   Special Requests NONE  Final   Gram Stain   Final  FEW WBC PRESENT, PREDOMINANTLY MONONUCLEAR NO ORGANISMS SEEN [facility]    Culture   Final    NO GROWTH 1 DAY [facility]    Report Status PENDING  Incomplete  AFB culture with smear     Status: None (Preliminary result)   Collection Time: 06/21/14  3:32 PM  Result Value Ref Range Status   Specimen Description WOUND RIGHT ANKLE  Final   Special Requests NONE  Final   Acid Fast Smear   Final    NO ACID FAST BACILLI SEEN [facility]    Culture   Final    CULTURE WILL BE EXAMINED FOR 6 WEEKS BEFORE ISSUING A FINAL REPORT [facility]    Report Status PENDING  Incomplete    Studies/Results: No results found.    Assessment/Plan:  Active Problems:   Hardware complicating wound infection    Connor LeaderSebastian Frazier is a 59 y.o. male with  Hardware associated deep infection left ankle  #1 Hardware associated deep infection status post surgery on April the 26 with placement of antibiotic beads then status post surgery on May 21, with removal of antibiotic beads and deep cultures obtained for aerobic anaerobic fungal and AFB cultures.  So far initial bacterial cultures have not yet yielded an organism. While the patient was off antibiotics for most of this week systemically he did have the antibiotic beads that were in the wound and these themselves could, complicate obtaining a high yield on bacterial cultures. Furthermore he had been on systemic antibiotics previously. In answer the question as to why he did not grow an organism on the first culture that was obtained I do not know whether or not he may have received his systemic antibiotics preoperatively or not it looks as if he was receiving Zosyn that same day of the surgery but I cannot tell in Epic whether he received this before the surgery or  if it was held until afterwards.  For now we will continue with IV daptomycin and IV ceftriaxone  I will check an MRI of the right ankle. His serum creatinine is 1.48 so I do not know if gadolinium can be given IV initially written the order without contrast I will defer to nephrology and radiology whether or not he would feel comfortable letting him have gadolinium. If so I would prefer it with gadolinium I discussed with the patient and with orthopedics the fact that this scan potentially could yield a lot of "false positive results and he will need to interpret the scan with caution. It may however assure the patient at least if we can exclude more serious deep sources of infection such as an undrained abscess. I am skeptical we'll find something new but we'll proceed with imaging in order to reassure the patient that there was not an area that needs to be attended to surgically at this point he does still have hardware in the medial aspect of his ankle or the fracture occurred. It was not felt to be infected is not overtly infected at the bedside.  I spent greater than 60 minutes with the patient including greater than 50% of time in face to face counsel of the patient guarding the nature of his infection the way we collect cultures the ways that cultures might not yield organisms and the ways would proceed forward with antibiotics. and in coordination of their care with Orthopedics.    LOS: 3 days  Acey Lav 06/22/2014, 6:23 PM

## 2014-06-22 NOTE — Evaluation (Signed)
Physical Therapy Evaluation Patient Details Name: Connor Frazier MRN: [id number] DOB: [birth year] Today's Date: 06/22/2014   History of Present Illness  Pt. admitted 06/19/14, underwent removal of infected right ankle hardware with placement of antibiotic beads.  Pt. is NWB status with cam boot  Clinical Impression  Pt. Presents to PT with apparent nerve block still in effect with residual right LE weakness, numbness.  Pt. With decreased independence of functional mobility and gait due to post op status.  Will benefit from acute PT to facilitate return to functional modified independence for return home when medically ready and with only intermittent assistance.  Pt. Expects he will use RW and not his knee walker.    Follow Up Recommendations No PT follow up    Equipment Recommendations  None recommended by PT    Recommendations for Other Services       Precautions / Restrictions Precautions Precautions: Fall Required Braces or Orthoses: Other Brace/Splint Other Brace/Splint: cam walker Restrictions Weight Bearing Restrictions: Yes RLE Weight Bearing: Non weight bearing      Mobility  Bed Mobility Overal bed mobility: Modified Independent             General bed mobility comments: extra time, use of bedrail  Transfers Overall transfer level: Needs assistance Equipment used: Rolling walker (2 wheeled) Transfers: Sit to/from Stand Sit to Stand: Min guard         General transfer comment: min guard for safety first time up post operatively  Ambulation/Gait Ambulation/Gait assistance: Min assist;+2 safety/equipment Ambulation Distance (Feet): 100 Feet Assistive device: Rolling walker (2 wheeled) Gait Pattern/deviations: Step-to pattern (single leg hop due to NWB status) Gait velocity: decreased   General Gait Details: Good use of RW, no overt LOB noted; able to fully comply with NWB status.    Stairs            Wheelchair Mobility    Modified  Rankin (Stroke Patients Only)       Balance Overall balance assessment: Needs assistance Sitting-balance support: No upper extremity supported;Feet supported Sitting balance-Leahy Scale: Normal     Standing balance support: Bilateral upper extremity supported;During functional activity Standing balance-Leahy Scale: Poor Standing balance comment: poor due to need for RW/NWB status; no LOB wiht use of RW                             Pertinent Vitals/Pain Pain Assessment: No/denies pain (pt. states foot/ankle still numb from nerve block)    Home Living Family/patient expects to be discharged to:: Private residence Living Arrangements: Spouse/significant other Available Help at Discharge: Family;Available PRN/intermittently Type of Home: House Home Access: Level entry     Home Layout: One level Home Equipment: Walker - 2 wheels;Crutches (knee walker) Additional Comments: Pt. states he has mostly been using RW, does not use knee walker    Prior Function Level of Independence: Independent with assistive device(s);Independent               Hand Dominance   Dominant Hand: Right    Extremity/Trunk Assessment   Upper Extremity Assessment: Overall WFL for tasks assessed           Lower Extremity Assessment: Overall WFL for tasks assessed;RLE deficits/detail RLE Deficits / Details: decreased strength/ noted right foot rests in plantar flexion but can gently be brought to near neutral passively and with application of cam walker    Cervical / Trunk Assessment: Normal  Communication   Communication:  No difficulties  Cognition Arousal/Alertness: Awake/alert Behavior During Therapy: WFL for tasks assessed/performed Overall Cognitive Status: Within Functional Limits for tasks assessed                      General Comments General comments (skin integrity, edema, etc.): Right foot rests in plantar flexion currently but able to tolerate application of  cam boot with neutral ankle position    Exercises        Assessment/Plan    PT Assessment Patient needs continued PT services  PT Diagnosis Difficulty walking;Abnormality of gait   PT Problem List Decreased strength;Decreased range of motion;Decreased activity tolerance;Decreased mobility;Decreased balance  PT Treatment Interventions DME instruction;Gait training;Functional mobility training;Therapeutic exercise;Balance training;Patient/family education   PT Goals (Current goals can be found in the Care Plan section) Acute Rehab PT Goals Patient Stated Goal: home with use of RW PT Goal Formulation: With patient Time For Goal Achievement: 06/29/14 Potential to Achieve Goals: Good    Frequency Min 6X/week   Barriers to discharge        Co-evaluation               End of Session Equipment Utilized During Treatment: Gait belt (cam boot) Activity Tolerance: Patient tolerated treatment well;No increased pain Patient left: in chair;with call bell/phone within reach Nurse Communication: Mobility status         Time: 0850-0920 PT Time Calculation (min) (ACUTE ONLY): 30 min   Charges:   PT Evaluation $Initial PT Evaluation Tier I: 1 Procedure PT Treatments $Gait Training: 8-22 mins   PT G Codes:        Ferman HammingBlankenship, Hamsini Verrilli B 06/22/2014, 4:30 PM Weldon PickingSusan Naraya Stoneberg PT Acute Rehab Services [phone] Beeper [phone]

## 2014-06-22 NOTE — Progress Notes (Signed)
Subjective: Interval History: has no complaint, concern of AB and kidneys.  Objective: Vital signs in last 24 hours: Temp:  [97.5 F (36.4 C)-98 F (36.7 C)] 97.7 F (36.5 C) (05/21 0513) Pulse Rate:  [58-81] 75 (05/21 0513) Resp:  [9-19] 15 (05/21 0513) BP: (107-126)/(49-83) 111/64 mmHg (05/21 0513) SpO2:  [96 %-99 %] 97 % (05/21 0513) Weight change:   Intake/Output from previous day: 05/20 0701 - 05/21 0700 In: 1442 [P.O.:480; I.V.:800; IV Piggyback:162] Out: 1800 [Urine:1700; Drains:50; Blood:50] Intake/Output this shift: Total I/O In: 240 [P.O.:240] Out: 375 [Urine:375]  General appearance: alert and cooperative Resp: clear to auscultation bilaterally Cardio: S1, S2 normal GI: soft, non-tender; bowel sounds normal; no masses,  no organomegaly Extremities: VAC lat on ankle, no edema  Lab Results:  Recent Labs  06/19/14 2023 06/20/14 0551  WBC 6.6 6.5  HGB 14.8 14.2  HCT 45.6 42.2  PLT 195 181   BMET:  Recent Labs  06/20/14 0551 06/21/14 0430  NA 140 139  K 3.9 4.1  CL 107 105  CO2 24 27  GLUCOSE 81 79  BUN 17 11  CREATININE 1.44* 1.48*  CALCIUM 8.7* 9.2   No results for input(s): PTH in the last 72 hours. Iron Studies: No results for input(s): IRON, TIBC, TRANSFERRIN, FERRITIN in the last 72 hours.  Studies/Results: [place] Renal  06/20/2014   CLINICAL DATA:  Acute kidney injury.  EXAM: RENAL / URINARY TRACT ULTRASOUND COMPLETE  COMPARISON:  None.  FINDINGS: Right Kidney:  Length: 10.7 cm. Echogenicity within normal limits. No mass or hydronephrosis visualized.  Left Kidney:  Length: 11.3 cm. Echogenicity within normal limits. No mass or hydronephrosis visualized.  Bladder:  Appears normal for degree of bladder distention. Prostate gland appears mildly enlarged measuring 4.2 x 3.8 x 6.0 cm.  IMPRESSION: 1. Normal appearance of the kidneys and bladder. 2. Prostate gland appears mildly enlarged.   Electronically Signed   By: Norva PavlovElizabeth  Brown M.D.   On:  06/20/2014 13:13    I have reviewed the patient's current medications.  Assessment/Plan: 1  CKD 3 stable, Meds to be dose adjusted.  Discussed risks of renal injury.  Ck to be monitored with Dapto, check chem q mon .  No PICCs at his GFR and some probability of ESRD in future 2 Ankle infx P No PICCs  Rec monitor CK, check renal profile q mon on AB  . Will S/O for now and see again at your request    LOS: 3 days   Connor Frazier 06/22/2014,10:08 AM

## 2014-06-23 ENCOUNTER — Inpatient Hospital Stay (HOSPITAL_COMMUNITY): Payer: BLUE CROSS/BLUE SHIELD

## 2014-06-23 MED ORDER — GADOBENATE DIMEGLUMINE 529 MG/ML IV SOLN
20.0000 mL | Freq: Once | INTRAVENOUS | Status: AC | PRN
  Administered 2014-06-23: 20 mL via INTRAVENOUS

## 2014-06-23 NOTE — Progress Notes (Signed)
PT Cancellation Note  Patient Details Name: Connor LeaderSebastian Frazier MRN: [id number] DOB: Jun 27, 1955   Cancelled Treatment:    Reason Eval/Treat Not Completed: Fatigue/lethargy limiting ability to participate. Pt politely declined Rx today stating he is just mentally tired from all that has happened the past couple days. He reports ambulating in room with RW to/from bathroom. He was out of his room for MRI this AM. PT to check back tomorrow.   Ilda FoilGarrow, Aarib Pulido Rene 06/23/2014, 2:01 PM

## 2014-06-23 NOTE — Progress Notes (Signed)
Patient stable pain controlled Wound VAC in place Spent 45 minutes today discussing with the husband and wife the course of events and what is known and unknown about his current situation All cultures negative to date MRI scan is nondiagnostic on the medial side which has no evidence of infection erythema or cellulitis and shows expected postoperative changes on the lateral side without definite evidence of abscess. Anticipate 6 weeks IV antibiotics. He essentially has 3 hurdles to overcome #1 getting rid of the bone infection #2 getting a soft tissue to heal #3 getting the bone to heal Anticipate further extensive discussion tomorrow

## 2014-06-24 LAB — URIC ACID: Uric Acid, Serum: 5.9 mg/dL (ref 4.4–7.6)

## 2014-06-24 LAB — TISSUE CULTURE: Culture: NO GROWTH

## 2014-06-24 LAB — CK: Total CK: 101 U/L (ref 49–397)

## 2014-06-24 NOTE — Progress Notes (Signed)
Physical Therapy Treatment Patient Details Name: Connor Frazier MRN: [id number] DOB: 1956-01-02 Today's Date: 06/24/2014    History of Present Illness Pt. admitted 06/19/14, underwent removal of infected right ankle hardware with placement of antibiotic beads.  Pt. is NWB status with cam boot    PT Comments    Patient progressing well with ambulation. Good overall balance and compliance to NWBing status. IV leaking during session and RN aware. Patient safe to D/C from a mobility standpoint based on progression towards goals set on PT eval.    Follow Up Recommendations  No PT follow up     Equipment Recommendations  None recommended by PT    Recommendations for Other Services       Precautions / Restrictions Precautions Precautions: Fall Required Braces or Orthoses: Other Brace/Splint Other Brace/Splint: cam walker Restrictions RLE Weight Bearing: Non weight bearing    Mobility  Bed Mobility               General bed mobility comments: Patient up in recliner before and after session  Transfers Overall transfer level: Modified independent                  Ambulation/Gait Ambulation/Gait assistance: Supervision Ambulation Distance (Feet): 200 Feet Assistive device: Rolling walker (2 wheeled) Gait Pattern/deviations: Step-to pattern Gait velocity: decreased   General Gait Details: Good use of RW, no overt LOB noted; able to fully comply with NWB status.     Stairs            Wheelchair Mobility    Modified Rankin (Stroke Patients Only)       Balance                                    Cognition Arousal/Alertness: Awake/alert Behavior During Therapy: WFL for tasks assessed/performed Overall Cognitive Status: Within Functional Limits for tasks assessed                      Exercises      General Comments        Pertinent Vitals/Pain Pain Assessment: No/denies pain    Home Living                       Prior Function            PT Goals (current goals can now be found in the care plan section) Progress towards PT goals: Progressing toward goals    Frequency  Min 6X/week    PT Plan Current plan remains appropriate    Co-evaluation             End of Session   Activity Tolerance: Patient tolerated treatment well Patient left: in chair;with call bell/phone within reach     Time: 1105-1133 PT Time Calculation (min) (ACUTE ONLY): 28 min  Charges:  $Gait Training: 23-37 mins                    G Codes:      Fredrich BirksRobinette, Paola Aleshire Elizabeth 06/24/2014, 12:44 PM 06/24/2014 Fredrich Birksobinette, Kaylena Pacifico Elizabeth PTA [phone] pager [phone] office

## 2014-06-24 NOTE — Anesthesia Postprocedure Evaluation (Signed)
Anesthesia Post Note  Patient: Connor Frazier  Procedure(s) Performed: Procedure(s) (LRB): Removal Hardware Right Ankle, Place Antibiotic Beads, and Wound VAC (Right)  Anesthesia type: general  Patient location: PACU  Post pain: Pain level controlled  Post assessment: Patient's Cardiovascular Status Stable  Post vital signs: Reviewed and stable  Level of consciousness: sedated  Complications: No apparent anesthesia complications

## 2014-06-24 NOTE — Progress Notes (Signed)
INFECTIOUS DISEASE PROGRESS NOTE  ID: Connor Frazier is a 59 y.o. male with  Active Problems:   Hardware complicating wound infection   CKD (chronic kidney disease)  Subjective: Concerns about his cx  Abtx:  Anti-infectives    Start     Dose/Rate Route Frequency Ordered Stop   06/21/14 1800  DAPTOmycin (CUBICIN) 600 mg in sodium chloride 0.9 % IVPB     600 mg 224 mL/hr over 30 Minutes Intravenous Every 24 hours 06/21/14 1735     06/21/14 1730  cefTRIAXone (ROCEPHIN) 2 g in dextrose 5 % 50 mL IVPB - Premix     2 g 100 mL/hr over 30 Minutes Intravenous Every 24 hours 06/21/14 1646     06/21/14 1645  ceFAZolin (ANCEF) IVPB 1 g/50 mL premix  Status:  Discontinued     1 g 100 mL/hr over 30 Minutes Intravenous Every 6 hours 06/21/14 1633 06/21/14 1646   06/21/14 1551  vancomycin (VANCOCIN) powder  Status:  Discontinued       As needed 06/21/14 1551 06/21/14 1603   06/21/14 1530  gentamicin (GARAMYCIN) injection  Status:  Discontinued       As needed 06/21/14 1551 06/21/14 1603      Medications:  Scheduled: . cefTRIAXone (ROCEPHIN)  IV  2 g Intravenous Q24H  . DAPTOmycin (CUBICIN)  IV  600 mg Intravenous Q24H    Objective: Vital signs in last 24 hours: Temp:  [97.4 F (36.3 C)-97.8 F (36.6 C)] 97.8 F (36.6 C) (05/23 1300) Pulse Rate:  [56-84] 84 (05/23 1300) Resp:  [16-18] 18 (05/23 1300) BP: (111-124)/(65-87) 124/87 mmHg (05/23 1300) SpO2:  [96 %-99 %] 99 % (05/23 1300)   General appearance: alert, cooperative and no distress Extremities: LLE wrapped  Lab Results  Recent Labs  06/22/14 1230  NA 136  K 4.0  CL 103  CO2 23  BUN 16  CREATININE 1.46*   Liver Panel  Recent Labs  06/22/14 1230  ALBUMIN 3.4*   Sedimentation Rate No results for input(s): ESRSEDRATE in the last 72 hours. C-Reactive Protein No results for input(s): CRP in the last 72 hours.  Microbiology: Recent Results (from the past 240 hour(s))  Anaerobic culture     Status:  None (Preliminary result)   Collection Time: 06/21/14  3:32 PM  Result Value Ref Range Status   Specimen Description WOUND RIGHT ANKLE  Final   Special Requests NONE  Final   Gram Stain   Final    FEW WBC PRESENT, PREDOMINANTLY MONONUCLEAR NO ORGANISMS SEEN Performed at [facility]    Culture   Final    NO ANAEROBES ISOLATED; CULTURE IN PROGRESS FOR 5 DAYS Performed at [facility]    Report Status PENDING  Incomplete  Fungus Culture with Smear     Status: None (Preliminary result)   Collection Time: 06/21/14  3:32 PM  Result Value Ref Range Status   Specimen Description WOUND RIGHT ANKLE  Final   Special Requests NONE  Final   Fungal Smear   Final    NO YEAST OR FUNGAL ELEMENTS SEEN Performed at [facility]    Culture   Final    CULTURE IN PROGRESS FOR FOUR WEEKS Performed at [facility]    Report Status PENDING  Incomplete  Tissue culture     Status: None   Collection Time: 06/21/14  3:32 PM  Result Value Ref Range Status   Specimen Description WOUND RIGHT ANKLE  Final  Special Requests NONE  Final   Gram Stain   Final    FEW WBC PRESENT, PREDOMINANTLY MONONUCLEAR NO ORGANISMS SEEN [facility]    Culture   Final    NO GROWTH 3 DAYS [facility]    Report Status 06/24/2014 FINAL  Final  AFB culture with smear     Status: None (Preliminary result)   Collection Time: 06/21/14  3:32 PM  Result Value Ref Range Status   Specimen Description WOUND RIGHT ANKLE  Final   Special Requests NONE  Final   Acid Fast Smear   Final    NO ACID FAST BACILLI SEEN [facility]    Culture   Final    CULTURE WILL BE EXAMINED FOR 6 WEEKS BEFORE ISSUING A FINAL REPORT [facility]    Report Status PENDING  Incomplete    Studies/Results: Dg Ankle Complete Right  06/23/2014   CLINICAL DATA:  Septic arthritis of right ankle. Surgery 1 month ago.  EXAM: RIGHT  ANKLE - COMPLETE 3+ VIEW  COMPARISON:  05/21/2014  FINDINGS: Two screws are noted within the distal right tibia across the medial malleolar fracture. Antibiotic beads project around the distal fibular shaft and metaphysis. These obscure underlying bone detail, but suspect continued distal fibular metaphyseal fracture. No additional acute bony abnormality.  IMPRESSION: Antibiotic beads around the distal fibula with probable underlying continued distal fibular fracture. Screws across the medial malleolar fracture.   Electronically Signed   By: Charlett NoseKevin  Dover M.D.   On: 06/23/2014 13:46   Mr Ankle Right W Wo Contrast  06/23/2014   CLINICAL DATA:  Postoperative infection of the ankle. Antibiotic beads and wound VAC hardware.  EXAM: MRI OF THE RIGHT ANKLE WITHOUT AND WITH CONTRAST  TECHNIQUE: Multiplanar, multisequence MR imaging of the ankle was performed before and after the administration of intravenous contrast.  CONTRAST:  20mL MULTIHANCE GADOBENATE DIMEGLUMINE 529 MG/ML IV SOLN  COMPARISON:  05/21/2014  FINDINGS: The metal artifact from the patient's medial malleolar fixation screws causes poor fat saturation which can mask enhancement.  Oblique distal fibular fracture noted with screw tracks in the proximal and distal fragments from prior fixation. Skin wound noted laterally with overlying bandaging.  Numerous low signal intensity antibiotic beads are present tracking along the posterior, lateral, and anterior margins of the fibula. Beads are also present along the posterior margin of the extensor digitorum longus muscle at and above the level of the anterior inferior tibia fibular ligament.  There is edema along the soft tissues adjacent to these beats but without a drainable abscess identified laterally. Expected enhancement is present in the soft tissues surrounding the fibula. There is certainly edema and enhancement within the distal fibular marrow although not twin unexpected degree to given the fractures  and prior hardware placement. I do not see overt bony destructive findings. Similarly I do not observe compelling findings of osteomyelitis in the rest of the hindfoot.  Mildly thickened plantar fascia. Edema tracks in Kager's fat pad. I suspect a tear of the inferior components of the spring ligament. The anterior talofibular ligament is not well seen and thought to be torn. The anterior inferior tibiofibular ligament is also indistinct.  IMPRESSION: IMPRESSION 1. Antibiotic beads track ground the fibula and also along the posterior margin of the extensor digitorum longus muscle, without a well-defined abscess. There is certainly irregular edema and enhancement in the distal fibula although this is not unexpected given the  fracture and recent hardware fixation, and is not thought to be specific for osteomyelitis at this point. No bony destruction is identified. 2. Metal artifact from the lag screws fixing the medial malleolus do cause field heterogeneity which can lower sensitivity. 3. The ATFL and anterior inferior tibiofibular ligament are probably torn.   Electronically Signed   By: Gaylyn Rong M.D.   On: 06/23/2014 13:41     Assessment/Plan: Septic arthritis R ankle (prosthetic) CKD stage 3a  Total days of antibiotics: 3 cubicin/ceftriaxone       Continue his current anbx Plan for 6 weeks total Will need weekly CBC, CMP, CK please send result to [phone] fax His Ck so far have been normal.  Please have him f/u in ID clinic in 1 month  available if questions    Johny Sax Infectious Diseases (pager) [phone] [email or web address] 06/24/2014, 2:33 PM  LOS: 5 days

## 2014-06-24 NOTE — Progress Notes (Addendum)
ANTIBIOTIC CONSULT NOTE Pharmacy Consult for Daptomycin  Indication: osteomyelitis   Allergies  Allergen Reactions  . Other     ANAESTHESIA MAKES PT VERY SICK ON STOMACH  . Penicillins Other (See Comments)    Upset stomach    Labs:  Recent Labs  06/22/14 1230  CREATININE 1.46*   Estimated Creatinine Clearance: 63.6 mL/min (by C-G formula based on Cr of 1.46). No results for input(s): VANCOTROUGH, VANCOPEAK, VANCORANDOM, GENTTROUGH, GENTPEAK, GENTRANDOM, TOBRATROUGH, TOBRAPEAK, TOBRARND, AMIKACINPEAK, AMIKACINTROU, AMIKACIN in the last 72 hours.   Medical History: Past Medical History  Diagnosis Date  . PONV (postoperative nausea and vomiting)   . Elevated serum creatinine   . Hypercholesterolemia     Medications:  Prescriptions prior to admission  Medication Sig Dispense Refill Last Dose  . aspirin EC 325 MG tablet Take 1 tablet (325 mg total) by mouth daily. 30 tablet 0 06/18/2014 at Unknown time  . HYDROcodone-acetaminophen (NORCO/VICODIN) 5-325 MG per tablet Take 1 tablet by mouth every 4 (four) hours as needed for moderate pain.   Past Week at Unknown time  . Multiple Vitamins-Minerals (MULTIVITAMIN WITH MINERALS) tablet Take 1 tablet by mouth daily.   06/18/2014 at Unknown time  . ondansetron (ZOFRAN) 4 MG tablet Take 4 mg by mouth every 8 (eight) hours as needed for nausea or vomiting.   Past Month at Unknown time  . Probiotic Product (PROBIOTIC DAILY PO) Take 1 tablet by mouth daily.   06/18/2014 at Unknown time  . Vitamin D, Ergocalciferol, (DRISDOL) 50000 UNITS CAPS capsule Take 50,000 Units by mouth every 7 (seven) days. 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  Wound VAC in place.  Cultures negative to date Continues on Daptomycin  Goal of Therapy:  Resolution of infection  Plan:  Daptomycin 600 mg IV q24h Ceftriaxone 2 g IV q24h CK q Mondays  Thank you. Okey RegalLisa Sneha Willig, PharmD [phone]  06/24/2014 1:04 PM

## 2014-06-24 NOTE — Progress Notes (Signed)
Patient ID: Connor Frazier, male   DOB: [birth year], 59 y.o.   MRN: [id number] Spent approximately 1 hour this evening with the patient and his wife reviewing the laboratory studies findings at surgery the plan of care and potential additional options available for further intervention. Patient and his wife state they would like to follow-up with the infectious disease physician Dr. Algis LimingVandam and would like to follow-up with the nephrologist to get a summary of their findings and there recommended additional steps. I have also ordered a rheumatologic workup to help identify if there is any other systemic issues that could impact the patient's ability to heal. We will plan on interventional radiology placing a long-term line tomorrow anticipate discontinuing the wound VAC tomorrow.

## 2014-06-24 NOTE — Progress Notes (Signed)
Patient ID: [name], male   DOB: 1955-03-24, 59 y.o.   MRN: [id number] Postoperative status post removal of deep retained hardware irrigation and debridement and placement of antibiotic beads right ankle. Incisional wound VAC functioning well. Patient's MRI scan is reviewed shows no definite abscess. Continue with current care anticipate discharge to home on Tuesday or Wednesday with antibiotic treatment as per infectious disease. IV access as per infectious disease as well. I will remove the wound VAC prior to discharge.

## 2014-06-25 ENCOUNTER — Encounter (HOSPITAL_COMMUNITY): Payer: Self-pay | Admitting: Orthopedic Surgery

## 2014-06-25 ENCOUNTER — Inpatient Hospital Stay (HOSPITAL_COMMUNITY): Payer: BLUE CROSS/BLUE SHIELD

## 2014-06-25 MED ORDER — CEFAZOLIN SODIUM-DEXTROSE 2-3 GM-% IV SOLR
INTRAVENOUS | Status: AC
  Administered 2014-06-25: 2 g via INTRAVENOUS
  Filled 2014-06-25: qty 50

## 2014-06-25 MED ORDER — SODIUM CHLORIDE 0.9 % IJ SOLN
10.0000 mL | INTRAMUSCULAR | Status: DC | PRN
  Administered 2014-06-25 – 2014-06-26 (×2): 20 mL
  Administered 2014-06-27 – 2014-06-28 (×4): 10 mL
  Filled 2014-06-25 (×6): qty 40

## 2014-06-25 MED ORDER — CEFTRIAXONE SODIUM IN DEXTROSE 40 MG/ML IV SOLN: 2.0000 g | INTRAVENOUS | Status: DC

## 2014-06-25 MED ORDER — LIDOCAINE HCL 1 % IJ SOLN
INTRAMUSCULAR | Status: AC
  Filled 2014-06-25: qty 20

## 2014-06-25 MED ORDER — CEFAZOLIN SODIUM-DEXTROSE 2-3 GM-% IV SOLR
2.0000 g | Freq: Once | INTRAVENOUS | Status: AC
  Administered 2014-06-25: 2 g via INTRAVENOUS
  Filled 2014-06-25: qty 50

## 2014-06-25 MED ORDER — SODIUM CHLORIDE 0.9 % IV SOLN: 600.0000 mg | INTRAVENOUS | Status: DC

## 2014-06-25 MED ORDER — FENTANYL CITRATE (PF) 100 MCG/2ML IJ SOLN
INTRAMUSCULAR | Status: AC
Start: 2014-06-25 — End: 2014-06-25
  Administered 2014-06-25: 50 ug/mL
  Filled 2014-06-25: qty 2

## 2014-06-25 NOTE — Progress Notes (Signed)
[facility] ROUNDING NOTE   Subjective:   Interval History: no complaints  Objective:  Vital signs in last 24 hours:  Temp:  [97.5 F (36.4 C)-98.7 F (37.1 C)] 97.5 F (36.4 C) (05/24 0515) Pulse Rate:  [67-80] 67 (05/24 0515) Resp:  [16-18] 18 (05/24 0515) BP: (112-115)/(65-80) 115/65 mmHg (05/24 0515) SpO2:  [95 %-96 %] 96 % (05/24 0515)  Weight change:  Filed Weights   06/20/14 0300  Weight: 90.7 kg (199 lb 15.3 oz)    Intake/Output: I/O last 3 completed shifts: In: 1000 [P.O.:1000] Out: 1100 [Urine:1100]   Intake/Output this shift:  Total I/O In: 240 [P.O.:240] Out: 225 [Urine:225]  CVS- RRR RS- CTA ABD- BS present soft non-distended EXT- no edema   Basic Metabolic Panel:  Recent Labs Lab 06/19/14 2023 06/20/14 0551 06/21/14 0430 06/22/14 1230  NA 139 140 139 136  K 4.4 3.9 4.1 4.0  CL 103 107 105 103  CO2 GLUCOSE 100* 81 79 93  BUN CREATININE 1.46* 1.44* 1.48* 1.46*  CALCIUM 9.5 8.7* 9.2 9.1  PHOS  --  4.7* 4.0 3.4    Liver Function Tests:  Recent Labs Lab 06/19/14 2023 06/20/14 0551 06/21/14 0430 06/22/14 1230  AST 23  --   --   --   ALT 37  --   --   --   ALKPHOS 52  --   --   --   BILITOT 0.6  --   --   --   PROT 7.3  --   --   --   ALBUMIN 3.6 3.0* 3.0* 3.4*   No results for input(s): LIPASE, AMYLASE in the last 168 hours. No results for input(s): AMMONIA in the last 168 hours.  CBC:  Recent Labs Lab 06/19/14 2023 06/20/14 0551  WBC 6.6 6.5  NEUTROABS 4.5 3.4  HGB 14.8 14.2  HCT 45.6 42.2  MCV 88.2 87.9  PLT 195 181    Cardiac Enzymes:  Recent Labs Lab 06/21/14 1931 06/24/14 0440  CKTOTAL 66 101    BNP: Invalid input(s): POCBNP  CBG: No results for input(s): GLUCAP in the last 168 hours.  Microbiology: Results for orders placed or performed during the hospital encounter of 06/19/14  Anaerobic culture     Status: None (Preliminary result)   Collection Time:  06/21/14  3:32 PM  Result Value Ref Range Status   Specimen Description WOUND RIGHT ANKLE  Final   Special Requests NONE  Final   Gram Stain   Final    FEW WBC PRESENT, PREDOMINANTLY MONONUCLEAR NO ORGANISMS SEEN Performed at [facility]    Culture   Final    NO ANAEROBES ISOLATED; CULTURE IN PROGRESS FOR 5 DAYS Performed at [facility]    Report Status PENDING  Incomplete  Fungus Culture with Smear     Status: None (Preliminary result)   Collection Time: 06/21/14  3:32 PM  Result Value Ref Range Status   Specimen Description WOUND RIGHT ANKLE  Final   Special Requests NONE  Final   Fungal Smear   Final    NO YEAST OR FUNGAL ELEMENTS SEEN Performed at [facility]    Culture   Final    CULTURE IN PROGRESS FOR FOUR WEEKS Performed at [facility]    Report Status PENDING  Incomplete  Tissue culture     Status: None   Collection Time: 06/21/14  3:32 PM  Result  Value Ref Range Status   Specimen Description WOUND RIGHT ANKLE  Final   Special Requests NONE  Final   Gram Stain   Final    FEW WBC PRESENT, PREDOMINANTLY MONONUCLEAR NO ORGANISMS SEEN [facility]    Culture   Final    NO GROWTH 3 DAYS [facility]    Report Status 06/24/2014 FINAL  Final  AFB culture with smear     Status: None (Preliminary result)   Collection Time: 06/21/14  3:32 PM  Result Value Ref Range Status   Specimen Description WOUND RIGHT ANKLE  Final   Special Requests NONE  Final   Acid Fast Smear   Final    NO ACID FAST BACILLI SEEN [facility] at [facility]    Culture   Final    CULTURE WILL BE EXAMINED FOR 6 WEEKS BEFORE ISSUING A FINAL REPORT [facility]    Report Status PENDING  Incomplete    Coagulation Studies: No results for input(s): LABPROT, INR in the last 72 hours.  Urinalysis: No results for input(s): COLORURINE, LABSPEC, PHURINE, GLUCOSEU, HGBUR, BILIRUBINUR,  KETONESUR, PROTEINUR, UROBILINOGEN, NITRITE, LEUKOCYTESUR in the last 72 hours.  Invalid input(s): APPERANCEUR    Imaging: Dg Ankle Complete Right  06/23/2014   CLINICAL DATA:  Septic arthritis of right ankle. Surgery 1 month ago.  EXAM: RIGHT ANKLE - COMPLETE 3+ VIEW  COMPARISON:  05/21/2014  FINDINGS: Two screws are noted within the distal right tibia across the medial malleolar fracture. Antibiotic beads project around the distal fibular shaft and metaphysis. These obscure underlying bone detail, but suspect continued distal fibular metaphyseal fracture. No additional acute bony abnormality.  IMPRESSION: Antibiotic beads around the distal fibula with probable underlying continued distal fibular fracture. Screws across the medial malleolar fracture.   Electronically Signed   By: Charlett NoseKevin  Dover M.D.   On: 06/23/2014 13:46     Medications:   . sodium chloride     . cefTRIAXone (ROCEPHIN)  IV  2 g Intravenous Q24H  . DAPTOmycin (CUBICIN)  IV  600 mg Intravenous Q24H   acetaminophen **OR** acetaminophen, HYDROmorphone (DILAUDID) injection, HYDROmorphone (DILAUDID) injection, methocarbamol **OR** methocarbamol (ROBAXIN)  IV, metoCLOPramide **OR** metoCLOPramide (REGLAN) injection, ondansetron **OR** ondansetron (ZOFRAN) IV, oxyCODONE, promethazine  Assessment/ Plan:   I answered all Mr Kolar's questions and informed him of risks of progressive renal disease. Proteinuria being a marker of renal disease . GFR stable and monitoring would be required.  He follows up with a Nephrologist in the area   He would like to know prognosis. I have informed him that it is variable but should he do well with his treatment it should be good  Avoid NSAIDS Maintain BP control Control lipids Avoid any other nephrotoxins  LOS: 6 [place] W @TODAY @1 :07 PM

## 2014-06-25 NOTE — Progress Notes (Signed)
Patient ID: Connor LeaderSebastian Frazier, male   DOB: December 27, 1955, 59 y.o.   MRN: [id number] Patient resting this morning comfortably. No drainage from the wound VAC. Will stop by this evening to remove the incisional wound VAC. Patient has requested infectious disease and nephrology to see him today. Plan for establishing long-term IV access for his IV antibiotics possible discharge to home tomorrow.

## 2014-06-25 NOTE — Progress Notes (Signed)
Patient ID: Connor LeaderSebastian Frazier, male   DOB: 10/07/1955, 59 y.o.   MRN: [id number] Patient is seen this evening in follow-up. Patient states that when he turns his neck he feels a pulling on the bandage from his central IV line. We will have the IV team come up to evaluate the dressing to see if there is any options. The events of the day with nephrology and infectious disease were reviewed his laboratory results were reviewed we discussed at length discharging with home health nursing and IV therapy at home. Both the patient and his wife state that they do not feel comfortable administering the antibiotics. Patient states he does not feel that he could get up to get the antibiotics to administer them himself. I will review this with home health to see if home health nursing is available for the duration of his anabolic's. The wound VAC has had no drainage this was removed a Mepilex dressing was applied with an Ace wrap. The wound edges are well approximated there is no drainage no cellulitis no signs of infection. Patient states that he feels that he could be discharged to home on Thursday. He states his wife is not available at home tomorrow for discharge.

## 2014-06-25 NOTE — Procedures (Signed)
Interventional Radiology Procedure Note  Procedure: Placement RIGHT IJ tunneled PICC.  Catheter tip distal SVC and ready for use.   Complications: 0  Estimated Blood Loss: 0  Recommendations: - Routine line care  Signed,  Sterling BigHeath K. McCullough, MD

## 2014-06-25 NOTE — Progress Notes (Signed)
Physical Therapy Treatment Patient Details Name: Connor LeaderSebastian Frazier MRN: [id number] DOB: [birth year] Today's Date: 06/25/2014    History of Present Illness Pt. admitted 06/19/14, underwent removal of infected right ankle hardware with placement of antibiotic beads.  Pt. is NWB status with cam boot    PT Comments    Patient continues to make great progress with overall mobility. OK to move in room without assistance once wound vac is DCd. Would review knee walker next session per patient requestt  Follow Up Recommendations  No PT follow up     Equipment Recommendations  None recommended by PT    Recommendations for Other Services       Precautions / Restrictions Precautions Precautions: Fall Required Braces or Orthoses: Other Brace/Splint Restrictions Weight Bearing Restrictions: Yes RLE Weight Bearing: Non weight bearing    Mobility  Bed Mobility Overal bed mobility: Modified Independent                Transfers Overall transfer level: Modified independent                  Ambulation/Gait Ambulation/Gait assistance: Supervision Ambulation Distance (Feet): 300 Feet Assistive device: Rolling walker (2 wheeled) Gait Pattern/deviations: Step-to pattern Gait velocity: decreased   General Gait Details: Good use of RW, no overt LOB noted; able to fully comply with NWB status.     Stairs            Wheelchair Mobility    Modified Rankin (Stroke Patients Only)       Balance                                    Cognition Arousal/Alertness: Awake/alert Behavior During Therapy: WFL for tasks assessed/performed Overall Cognitive Status: Within Functional Limits for tasks assessed                      Exercises      General Comments        Pertinent Vitals/Pain Pain Assessment: No/denies pain    Home Living                      Prior Function            PT Goals (current goals can now be found in the  care plan section) Progress towards PT goals: Progressing toward goals    Frequency  Min 4X/week    PT Plan Current plan remains appropriate    Co-evaluation             End of Session   Activity Tolerance: Patient tolerated treatment well Patient left: in chair;with call bell/phone within reach     Time: 1128-1151 PT Time Calculation (min) (ACUTE ONLY): 23 min  Charges:  $Gait Training: 8-22 mins $Therapeutic Activity: 8-22 mins                    G Codes:      Connor Frazier, Connor Frazier 06/25/2014, 12:08 PM 06/25/2014 Connor Birksobinette, Connor Frazier PTA [phone] pager [phone] office

## 2014-06-25 NOTE — Progress Notes (Signed)
INFECTIOUS DISEASE PROGRESS NOTE  ID: Connor Frazier is a 59 y.o. male with  Active Problems:   Hardware complicating wound infection   CKD (chronic kidney disease)  Subjective: No complaints.   Abtx:  Anti-infectives    Start     Dose/Rate Route Frequency Ordered Stop   06/21/14 1800  DAPTOmycin (CUBICIN) 600 mg in sodium chloride 0.9 % IVPB     600 mg 224 mL/hr over 30 Minutes Intravenous Every 24 hours 06/21/14 1735     06/21/14 1730  cefTRIAXone (ROCEPHIN) 2 g in dextrose 5 % 50 mL IVPB - Premix     2 g 100 mL/hr over 30 Minutes Intravenous Every 24 hours 06/21/14 1646     06/21/14 1645  ceFAZolin (ANCEF) IVPB 1 g/50 mL premix  Status:  Discontinued     1 g 100 mL/hr over 30 Minutes Intravenous Every 6 hours 06/21/14 1633 06/21/14 1646   06/21/14 1551  vancomycin (VANCOCIN) powder  Status:  Discontinued       As needed 06/21/14 1551 06/21/14 1603   06/21/14 1530  gentamicin (GARAMYCIN) injection  Status:  Discontinued       As needed 06/21/14 1551 06/21/14 1603      Medications:  Scheduled: . cefTRIAXone (ROCEPHIN)  IV  2 g Intravenous Q24H  . DAPTOmycin (CUBICIN)  IV  600 mg Intravenous Q24H    Objective: Vital signs in last 24 hours: Temp:  [97.5 F (36.4 C)-98.7 F (37.1 C)] 97.5 F (36.4 C) (05/24 0515) Pulse Rate:  [67-84] 67 (05/24 0515) Resp:  [16-18] 18 (05/24 0515) BP: (112-124)/(65-87) 115/65 mmHg (05/24 0515) SpO2:  [95 %-99 %] 96 % (05/24 0515)   General appearance: alert and no distress  RLE wrapped.   Lab Results No results for input(s): WBC, HGB, HCT, NA, K, CL, CO2, BUN, CREATININE, GLU in the last 72 hours.  Invalid input(s): PLATELETS Liver Panel No results for input(s): PROT, ALBUMIN, AST, ALT, ALKPHOS, BILITOT, BILIDIR, IBILI in the last 72 hours. Sedimentation Rate No results for input(s): ESRSEDRATE in the last 72 hours. C-Reactive Protein No results for input(s): CRP in the last 72 hours.  Microbiology: Recent Results  (from the past 240 hour(s))  Anaerobic culture     Status: None (Preliminary result)   Collection Time: 06/21/14  3:32 PM  Result Value Ref Range Status   Specimen Description WOUND RIGHT ANKLE  Final   Special Requests NONE  Final   Gram Stain   Final    FEW WBC PRESENT, PREDOMINANTLY MONONUCLEAR NO ORGANISMS SEEN Performed at [facility]    Culture   Final    NO ANAEROBES ISOLATED; CULTURE IN PROGRESS FOR 5 DAYS Performed at [facility]    Report Status PENDING  Incomplete  Fungus Culture with Smear     Status: None (Preliminary result)   Collection Time: 06/21/14  3:32 PM  Result Value Ref Range Status   Specimen Description WOUND RIGHT ANKLE  Final   Special Requests NONE  Final   Fungal Smear   Final    NO YEAST OR FUNGAL ELEMENTS SEEN Performed at [facility]    Culture   Final    CULTURE IN PROGRESS FOR FOUR WEEKS Performed at [facility]    Report Status PENDING  Incomplete  Tissue culture     Status: None   Collection Time: 06/21/14  3:32 PM  Result Value Ref Range Status   Specimen Description WOUND RIGHT ANKLE  Final  Special Requests NONE  Final   Gram Stain   Final    FEW WBC PRESENT, PREDOMINANTLY MONONUCLEAR NO ORGANISMS SEEN Performed at [facility]    Culture   Final    NO GROWTH 3 DAYS Performed at [facility]    Report Status 06/24/2014 FINAL  Final  AFB culture with smear     Status: None (Preliminary result)   Collection Time: 06/21/14  3:32 PM  Result Value Ref Range Status   Specimen Description WOUND RIGHT ANKLE  Final   Special Requests NONE  Final   Acid Fast Smear   Final    NO ACID FAST BACILLI SEEN Performed at [facility]    Culture   Final    CULTURE WILL BE EXAMINED FOR 6 WEEKS BEFORE ISSUING A FINAL REPORT Performed at [facility]    Report Status PENDING  Incomplete    Studies/Results: Dg Ankle Complete Right  06/23/2014   CLINICAL DATA:  Septic  arthritis of right ankle. Surgery 1 month ago.  EXAM: RIGHT ANKLE - COMPLETE 3+ VIEW  COMPARISON:  05/21/2014  FINDINGS: Two screws are noted within the distal right tibia across the medial malleolar fracture. Antibiotic beads project around the distal fibular shaft and metaphysis. These obscure underlying bone detail, but suspect continued distal fibular metaphyseal fracture. No additional acute bony abnormality.  IMPRESSION: Antibiotic beads around the distal fibula with probable underlying continued distal fibular fracture. Screws across the medial malleolar fracture.   Electronically Signed   By: Charlett Nose M.D.   On: 06/23/2014 13:46   Mr Ankle Right W Wo Contrast  06/25/2014   ADDENDUM REPORT: 06/25/2014 08:12  ADDENDUM: The original report was by Dr. Ova Freshwater. The following addendum is by Dr. Gaylyn Rong:  Please note that impression #1 should read, "Antibiotic beads track AROUND the fibula."   Electronically Signed   By: Gaylyn Rong M.D.   On: 06/25/2014 08:12   06/25/2014   CLINICAL DATA:  Postoperative infection of the ankle. Antibiotic beads and wound VAC hardware.  EXAM: MRI OF THE RIGHT ANKLE WITHOUT AND WITH CONTRAST  TECHNIQUE: Multiplanar, multisequence MR imaging of the ankle was performed before and after the administration of intravenous contrast.  CONTRAST:  20mL MULTIHANCE GADOBENATE DIMEGLUMINE 529 MG/ML IV SOLN  COMPARISON:  05/21/2014  FINDINGS: The metal artifact from the patient's medial malleolar fixation screws causes poor fat saturation which can mask enhancement.  Oblique distal fibular fracture noted with screw tracks in the proximal and distal fragments from prior fixation. Skin wound noted laterally with overlying bandaging.  Numerous low signal intensity antibiotic beads are present tracking along the posterior, lateral, and anterior margins of the fibula. Beads are also present along the posterior margin of the extensor digitorum longus muscle at and above the  level of the anterior inferior tibia fibular ligament.  There is edema along the soft tissues adjacent to these beats but without a drainable abscess identified laterally. Expected enhancement is present in the soft tissues surrounding the fibula. There is certainly edema and enhancement within the distal fibular marrow although not twin unexpected degree to given the fractures and prior hardware placement. I do not see overt bony destructive findings. Similarly I do not observe compelling findings of osteomyelitis in the rest of the hindfoot.  Mildly thickened plantar fascia. Edema tracks in Kager's fat pad. I suspect a tear of the inferior components of the spring ligament. The anterior talofibular ligament is not well seen and  thought to be torn. The anterior inferior tibiofibular ligament is also indistinct.  IMPRESSION: IMPRESSION 1. Antibiotic beads track ground the fibula and also along the posterior margin of the extensor digitorum longus muscle, without a well-defined abscess. There is certainly irregular edema and enhancement in the distal fibula although this is not unexpected given the fracture and recent hardware fixation, and is not thought to be specific for osteomyelitis at this point. No bony destruction is identified. 2. Metal artifact from the lag screws fixing the medial malleolus do cause field heterogeneity which can lower sensitivity. 3. The ATFL and anterior inferior tibiofibular ligament are probably torn.  Electronically Signed: By: Gaylyn RongWalter  Liebkemann M.D. On: 06/23/2014 13:41     Assessment/Plan: Septic arthritis R ankle (prosthetic) CKD stage 3a  Total days of antibiotics: 4 cubicin/ceftriaxone  Asked to come see by Dr Lajoyce Cornersuda and pt.  I explained his 4 cultures. 3 are still pending with negative smears.  He asked if he could have leukemia in his bone- I let him know that we do not have path samples.  I read and explained his MRI result to him.  I reassured him that his current  anbx are not typically renally toxic. I also reminded him that we will check his liver and kidney function tests weekly. And that his last liver function tests were normal.  I asked him to call if has any diarrhea.   I told him I am not sure what to expect if his f/u MRI at the end of anbx did not show bone healing.  I gave him my card and told him we will see him in the ID clinic.           Johny SaxJeffrey Shayde Gervacio Infectious Diseases (pager) [phone] [email or web address] 06/25/2014, 12:34 PM  LOS: 6 days

## 2014-06-26 ENCOUNTER — Inpatient Hospital Stay (HOSPITAL_COMMUNITY): Payer: BLUE CROSS/BLUE SHIELD

## 2014-06-26 DIAGNOSIS — Z9889 Other specified postprocedural states: Secondary | ICD-10-CM

## 2014-06-26 LAB — ANAEROBIC CULTURE

## 2014-06-26 LAB — RHEUMATOID FACTOR: RHEUMATOID FACTOR: 8.5 [IU]/mL (ref 0.0–13.9)

## 2014-06-26 MED ORDER — ASPIRIN EC 325 MG PO TBEC
650.0000 mg | DELAYED_RELEASE_TABLET | Freq: Every day | ORAL | Status: DC
  Administered 2014-06-26 – 2014-06-28 (×3): 650 mg via ORAL
  Filled 2014-06-26 (×4): qty 2

## 2014-06-26 MED FILL — Fentanyl Citrate Preservative Free (PF) Inj 100 MCG/2ML: INTRAMUSCULAR | Qty: 1 | Status: AC

## 2014-06-26 NOTE — Care Management Note (Signed)
Case Management Note  Patient Details  Name: Reece LeaderSebastian Ferns MRN: [id number] Date of Birth: July 20, 1955  Subjective/Objective:   Patient is from home. S/P removal Hardware Right Ankle, Place Antibiotic Beads, and Wound VAC (Right) as a surgical intervention .                Action/Plan:[facility]   Expected Discharge Date:       06/27/14           Expected Discharge Plan:  Home w Home Health Services  In-House Referral:     Discharge planning Services  CM Consult  Post Acute Care Choice:  Home Health Choice offered to:  Patient  DME Arranged:  Wheelchair manual DME Agency:  [facility]  HH Arranged:  RN, IV Antibiotics HH Agency:  [facility]  Status of Service:  In process, will continue to follow  Medicare Important Message Given:    Date Medicare IM Given:    Medicare IM give by:    Date Additional Medicare IM Given:    Additional Medicare Important Message give by:     If discussed at Long Length of Stay Meetings, dates discussed:    Additional Comments:  Governor Speckingdekunle, Sherlock Nancarrow Bimbo, RN 06/26/2014, 8:16 PM

## 2014-06-26 NOTE — Progress Notes (Signed)
*  PRELIMINARY RESULTS* Vascular Ultrasound Lower extremity venous duplex has been completed.  Preliminary findings: no evidence of DVT  Farrel DemarkJill Eunice, RDMS, RVT  06/26/2014, 2:03 PM

## 2014-06-26 NOTE — Progress Notes (Signed)
Patient ID: Connor Frazier, male   DOB: 09-Nov-1955, 59 y.o.   MRN: [id number] Patient comfortable this morning without complaints. Plan to set up home health IV therapy as well as in the home health assistance the patient will need. We will order ultrasound to rule out DVT prior to discharge to home.

## 2014-06-26 NOTE — Progress Notes (Signed)
CM talked to patient in his room.  Educates patient his wheelchair has been arranged with [facility].  CM called [facility]ain [facility] [phone] and left voicemail message that patient will need wheelchair for d/c of 06/27/14.

## 2014-06-26 NOTE — Progress Notes (Signed)
Spoke with patient about home IV antibiotics, patient is agreeable to learning to give IV antibiotics. Spoke with Tamera PuntMiranda and Pam at [facility], [facility] and will receive initial instructions today. Spoke with patient about equipment, he has a rolling walker, expressed need for wheelchair. Will contact [facility] to deliver wheelchair to room prior to discharge. Will continue to follow for d/c needs.

## 2014-06-27 NOTE — Progress Notes (Signed)
Patient ID: Connor LeaderSebastian Frazier, male   DOB: 1955-12-17, 59 y.o.   MRN: [id number] Patient is seen in follow-up. Patient complains of some pain medially and laterally to the ankle.  The dressing was removed he did have 2 Ace wraps wrapped around the ankle and this may have been causing the increased pressure and increased pain. The Ace wraps were removed. The Mepilex dressing was removed and the wound had a small spot of serosanguineous drainage. There is no cellulitis the wound edges were well approximated. A new Mepilex dressing was applied with a single Ace wrap. Patient expressed concern about the monitoring of lab work up postoperatively and I discussed this with the nursing director and she will coordinate to make sure the patient understands what labs will be drawn and on what interval. Patient also requested psychologic counseling. I discussed this with the director of the floor and she states that she will coordinate proper counseling. Anticipate further discussions with [facility] today to delineate the details of patient's discharge. We'll plan for discharge to home tomorrow. Ultrasound yesterday was negative for DVT in both lower extremities.

## 2014-06-27 NOTE — Progress Notes (Signed)
Physical Therapy Treatment Patient Details Name: Connor Frazier MRN: [id number] DOB: 12/28/1955 Today's Date: 06/27/2014    History of Present Illness Pt. admitted 06/19/14, underwent removal of infected right ankle hardware with placement of antibiotic beads.  Pt. is NWB status with cam boot    PT Comments    Patient Mod I with transfers and use of RW. Reviewed practice of knee walker with cues for safety and best use. Patient feels comfortable with use of knee walker and has one already at home. Patient awaiting WC for home use to increase functional independence as he is NWBing.   Follow Up Recommendations  No PT follow up     Equipment Recommendations  WC with elevating leg rest   Recommendations for Other Services       Precautions / Restrictions Precautions Precautions: Fall Other Brace/Splint: cam walker Restrictions Weight Bearing Restrictions: Yes RLE Weight Bearing: Non weight bearing    Mobility  Bed Mobility Overal bed mobility: Modified Independent                Transfers Overall transfer level: Modified independent                  Ambulation/Gait Ambulation/Gait assistance: Modified independent (Device/Increase time) Ambulation Distance (Feet): 120 Feet Assistive device: Rolling walker (2 wheeled)   Gait velocity: decreased   General Gait Details: Good use of RW, no overt LOB noted; able to fully comply with NWB status.     Stairs            Wheelchair Mobility    Modified Rankin (Stroke Patients Only)       Balance                                    Cognition Arousal/Alertness: Awake/alert Behavior During Therapy: WFL for tasks assessed/performed Overall Cognitive Status: Within Functional Limits for tasks assessed                      Exercises      General Comments        Pertinent Vitals/Pain Pain Assessment: No/denies pain    Home Living                      Prior  Function            PT Goals (current goals can now be found in the care plan section) Progress towards PT goals: Progressing toward goals    Frequency  Min 3X/week    PT Plan Current plan remains appropriate    Co-evaluation             End of Session   Activity Tolerance: Patient tolerated treatment well Patient left: in chair;with call bell/phone within reach     Time: 6213-08651045-1112 PT Time Calculation (min) (ACUTE ONLY): 27 min  Charges:  $Gait Training: 8-22 mins $Therapeutic Activity: 8-22 mins                    G Codes:      Fredrich BirksRobinette, Kizzie Cotten Elizabeth 06/27/2014, 2:17 PM  06/27/2014 Fredrich Birksobinette, Ashna Dorough Elizabeth PTA [phone] pager [phone] office

## 2014-06-27 NOTE — Progress Notes (Signed)
Patient ID: Connor Frazier, male   DOB: 1955/12/01, 59 y.o.   MRN: [id number] I had a long discussion with the patient and his wife regarding discharge planning. Patient and wife are comfortable with the proposed plans. They are still interested in psychological counseling and they state that Connor Frazier is going to follow up with them on this. They are concerned about the laboratory studies that will be drawn and they are interested in the schedule of when the labs will be drawn and what each laboratory study is designed to evaluate. They state they feel that [facility] will follow-up in answer this question. I will reevaluate the wound in the morning. Plan for discharge tomorrow. I will follow-up in the office next week. They will call me on my cell phone if there is any questions over the weekend.

## 2014-06-28 MED ORDER — HEPARIN SOD (PORK) LOCK FLUSH 100 UNIT/ML IV SOLN
250.0000 [IU] | INTRAVENOUS | Status: AC | PRN
  Administered 2014-06-28: 250 [IU]

## 2014-06-28 NOTE — Discharge Summary (Signed)
Physician Discharge Summary  Patient ID: Connor Frazier MRN: [id number] DOB/AGE: 1955/12/19 59 y.o.  Admit date: 06/19/2014 Discharge date: 06/28/2014  Admission Diagnoses: Infected deep retained hardware right ankle  Discharge Diagnoses:  Active Problems:   Hardware complicating wound infection   CKD (chronic kidney disease)   Discharged Condition: stable  Hospital Course: Patient's hospital course was essentially unremarkable he underwent removal of deep retained hardware placement of antibiotics beads placement of a wound VAC. Patient's cultures remain negative. I'll multiple studies have been obtained and these are all unremarkable. Patient's legs were examined for DVT and ultrasound was negative for DVT. Patient was discharged to home in stable condition with IV antibiotics for 6 weeks. I will follow-up in the office next week.  Consults: ID and nephrology  Significant Diagnostic Studies: labs: Routine labs and microbiology: wound culture: negative  Treatments: surgery: See operative note  Discharge Exam: Blood pressure 118/78, pulse 62, temperature 97.5 F (36.4 C), temperature source Oral, resp. rate 18, height  (1.803 m), weight 90.7 kg (199 lb 15.3 oz), SpO2 97 %. Incision/Wound: very small amount of clear serosanguineous drainage from the superior aspect of the wound most likely due to the antibiotics beads.  Disposition: 01-Home or Self Care  Discharge Instructions    Call MD / Call 911    Complete by:  As directed   If you experience chest pain or shortness of breath, CALL 911 and be transported to the hospital emergency room.  If you develope a fever above 101 F, pus (white drainage) or increased drainage or redness at the wound, or calf pain, call your surgeon's office.     Constipation Prevention    Complete by:  As directed   Drink plenty of fluids.  Prune juice may be helpful.  You may use a stool softener, such as Colace (over the counter) 100 mg twice  a day.  Use MiraLax (over the counter) for constipation as needed.     Diet - low sodium heart healthy    Complete by:  As directed      Increase activity slowly as tolerated    Complete by:  As directed             Medication List    TAKE these medications        aspirin EC 325 MG tablet  Take 1 tablet (325 mg total) by mouth daily.     cefTRIAXone 40 MG/ML IVPB  Commonly known as:  ROCEPHIN  Inject 50 mLs (2 g total) into the vein daily.     DAPTOmycin 600 mg in sodium chloride 0.9 % 100 mL  Inject 600 mg into the vein daily.     HYDROcodone-acetaminophen 5-325 MG per tablet  Commonly known as:  NORCO/VICODIN  Take 1 tablet by mouth every 4 (four) hours as needed for moderate pain.     multivitamin with minerals tablet  Take 1 tablet by mouth daily.     ondansetron 4 MG tablet  Commonly known as:  ZOFRAN  Take 4 mg by mouth every 8 (eight) hours as needed for nausea or vomiting.     PROBIOTIC DAILY PO  Take 1 tablet by mouth daily.     Vitamin D (Ergocalciferol) 50000 UNITS Caps capsule  Commonly known as:  DRISDOL  Take 50,000 Units by mouth every 7 (seven) days. Sunday or Monday           Follow-up Information    Follow up with [facility].  Why:  They will contact you regarding RN visits for your home IV antibiotics.   Contact information:   [address] [place] [address] [phone]       Follow up with Sarinah Doetsch V, MD In 1 week.   Specialty:  Orthopedic Surgery   Contact information:   [address] [place] [address] [phone]       Signed: Nadara MustardDUDA,Hlee Fringer V 06/28/2014, 7:00 AM

## 2014-06-28 NOTE — Progress Notes (Signed)
Patient to discharge to home today, set up with [facility] for home health RN services and home IV antibiotics.[facility] this am prior to discharge. [facility] has delivered the patient's manual wheelchair to his room, no other equipment needs identified by therapy or patient. Provided patient with my card and with [facility]'s contact information on his discharge instructions for any questions after discharge.

## 2014-06-28 NOTE — Progress Notes (Signed)
Patient d/c'd home via personal wheelchair with wife. Central line flushed and capped by IV team. No c/o pain. Discharge instructions and medications were reviewed and given. All questions answered. Pt verbalized understanding. Wife came in during the discharge process. She was asked if she had any questions. She stated she did not have any. [facility] nurse was at the bedside and completed her education. Patient was wheeled out, assisted into the backseat of the car and wheelchair was placed in the trunk. No belongings were left in the room.

## 2014-07-02 LAB — HLA-B27 ANTIGEN: DNA RESULT: NOT DETECTED

## 2014-07-10 DIAGNOSIS — M869 Osteomyelitis, unspecified: Secondary | ICD-10-CM | POA: Insufficient documentation

## 2014-07-10 DIAGNOSIS — M898X9 Other specified disorders of bone, unspecified site: Secondary | ICD-10-CM | POA: Insufficient documentation

## 2014-07-10 DIAGNOSIS — R918 Other nonspecific abnormal finding of lung field: Secondary | ICD-10-CM | POA: Insufficient documentation

## 2014-07-10 DIAGNOSIS — N182 Chronic kidney disease, stage 2 (mild): Secondary | ICD-10-CM | POA: Insufficient documentation

## 2014-07-11 ENCOUNTER — Telehealth: Payer: Self-pay | Admitting: *Deleted

## 2014-07-11 NOTE — Telephone Encounter (Signed)
Pt admitted to [facility] for "lung infection."  Dr. Clydie Braun, [facility], needs to confer with either Dr. Ninetta Lights or Dr. Daiva Eves about antibiotics for lung infection and current ABX for hardware infection.  Paged Dr. Daiva Eves.  Given Dr. Shari Prows pager number to confer.   [phone] pager number for Dr. Clydie Braun.

## 2014-07-11 NOTE — Telephone Encounter (Signed)
Dr. Silvestre Mesi actually wanted to talk to me and I got in touch with him

## 2014-07-19 LAB — FUNGUS CULTURE W SMEAR: Fungal Smear: NONE SEEN

## 2014-08-03 LAB — AFB CULTURE WITH SMEAR (NOT AT ARMC): Acid Fast Smear: NONE SEEN

## 2014-08-06 ENCOUNTER — Ambulatory Visit (INDEPENDENT_AMBULATORY_CARE_PROVIDER_SITE_OTHER): Payer: BLUE CROSS/BLUE SHIELD | Admitting: Infectious Disease

## 2014-08-06 ENCOUNTER — Encounter: Payer: Self-pay | Admitting: Infectious Disease

## 2014-08-06 VITALS — BP 128/82 | HR 67 | Temp 97.9°F

## 2014-08-06 DIAGNOSIS — M868X7 Other osteomyelitis, ankle and foot: Secondary | ICD-10-CM

## 2014-08-06 DIAGNOSIS — M869 Osteomyelitis, unspecified: Secondary | ICD-10-CM

## 2014-08-06 DIAGNOSIS — R918 Other nonspecific abnormal finding of lung field: Secondary | ICD-10-CM

## 2014-08-06 DIAGNOSIS — I2690 Septic pulmonary embolism without acute cor pulmonale: Secondary | ICD-10-CM | POA: Diagnosis not present

## 2014-08-06 DIAGNOSIS — I76 Septic arterial embolism: Secondary | ICD-10-CM | POA: Insufficient documentation

## 2014-08-06 DIAGNOSIS — T847XXD Infection and inflammatory reaction due to other internal orthopedic prosthetic devices, implants and grafts, subsequent encounter: Secondary | ICD-10-CM

## 2014-08-06 DIAGNOSIS — N183 Chronic kidney disease, stage 3 unspecified: Secondary | ICD-10-CM

## 2014-08-06 MED ORDER — SULFAMETHOXAZOLE-TRIMETHOPRIM 800-160 MG PO TABS: 1.0000 | ORAL_TABLET | Freq: Two times a day (BID) | ORAL | Status: DC

## 2014-08-06 MED ORDER — SODIUM CHLORIDE 0.9 % IV SOLN: 1.0000 g | Freq: Three times a day (TID) | INTRAVENOUS | Status: DC

## 2014-08-06 NOTE — Progress Notes (Signed)
Subjective:    Patient ID: Connor Frazier, male    DOB: [birth year], 59 y.o.   MRN: [id number]  HPI  59 y.o. male with Hardware associated deep infection left ankle who was initially seen by my partner Dr. Ninetta LightsHatcher in the hospital and also by myself [facility] the [facility].   He had initial  surgery on April the 26 with placement of antibiotic beads , and cultures that were obtained are unrevealingthen status post surgery on May 21, with removal of antibiotic beads and deep cultures obtained for aerobic anaerobic fungal and AFB cultures. None of these were revealing either. He was placed on IV daptomycin and ceftriaxone and discharged with plans to complete an 8 week course.  Unfortunately he was [facility] with flank pain and found to have on imaging by chest x-ray and CT scan evidence of what appeared to be likely septic emboli with nodular lesions with cavitation throughout the lungs. He had blood cultures obtained which were negative. He was seen by Dr[facility] Silvest[facility]al [facility] and anti-biotic's were broadened to Zyvox and Zosyn. Patient had a transthoracic echocardiogram which failed to show any evidence of vegetations on his heart valves. He did not undergo a transesophageal echocardiogram. The central line was removed and apparently per the patient was thought to look infectious by the nurse who removed it. The catheter tip was cultured but no growth was obtained. The patient was then discharged to home on oral Zyvox and IV meropenem. Since then he developed thrombocytopenia with his platelets dipping to 114 when checked on June 27. Dr.Bhusal continue the patient's Zyvox and is remainder meropenem since then. Patient presents for follow-up with me today. He is breathing better still coughing some at times. Foot feels better though he still not in weight. There is plans for the 2 pins on one side to still be removed when his fracture is healed  ultimately.    Review of Systems  Constitutional: Negative for fever, chills, diaphoresis, activity change, appetite change, fatigue and unexpected weight change.  HENT: Negative for congestion, rhinorrhea, sinus pressure, sneezing, sore throat and trouble swallowing.   Eyes: Negative for photophobia and visual disturbance.  Respiratory: Positive for cough. Negative for chest tightness, shortness of breath, wheezing and stridor.   Cardiovascular: Negative for chest pain, palpitations and leg swelling.  Gastrointestinal: Negative for nausea, vomiting, abdominal pain, diarrhea, constipation, blood in stool, abdominal distention and anal bleeding.  Genitourinary: Negative for dysuria, hematuria, flank pain and difficulty urinating.  Musculoskeletal: Positive for myalgias. Negative for back pain, joint swelling, arthralgias and gait problem.  Skin: Negative for color change, pallor, rash and wound.  Neurological: Negative for dizziness, tremors, weakness and light-headedness.  Hematological: Negative for adenopathy. Does not bruise/bleed easily.  Psychiatric/Behavioral: Negative for behavioral problems, confusion, sleep disturbance, dysphoric mood, decreased concentration and agitation.       Objective:   Physical Exam  Constitutional: He is oriented to person, place, and time. He appears well-developed and well-nourished.  HENT:  Head: Normocephalic and atraumatic.  Eyes: Conjunctivae and EOM are normal.  Neck: Normal range of motion. Neck supple.  Cardiovascular: Normal rate, regular rhythm, normal heart sounds and intact distal pulses.  Exam reveals no gallop and no friction rub.   No murmur heard. Pulmonary/Chest: Effort normal and breath sounds normal. No respiratory distress. He has no wheezes. He has no rales.  Abdominal: Soft. He exhibits no distension.  Musculoskeletal: Normal range of motion. He  exhibits no edema or tenderness.       Arms:      Legs: Neurological: He is alert  and oriented to person, place, and time.  Skin: Skin is warm and dry.          Assessment & Plan:   #1 New problem of septic emboli to the lungs: os abilities include right-sided endocarditis though this was not seen on transthoracic echocardiogram. Also he could've had an infection of his central line with embolization of clot to the lungs. Regardless he needs to be treated for 6 weeks with IV systemic and biotics. Because the patient developed from a cytopenia on Zyvox we cannot use it anymore. We cannot use daptomycin due to its problems of not being active in the lungs. Patient would not be anxious to be on IV vancomycin due to his chronic kidney disease and his fear of nephrotoxicity from vancomycin. IV Teflaro would be a possibility but would put him on 2 beta lactams and increased risk for seizures. Instead I would like to substitute anti-coag negative staph coverage and anti-MRSA coverage with oral Bactrim double strength 1 tablet twice daily. We will recheck his labs next week to make sure his kidney function is not significantly affected. As we know Bactrim can cause pseudo-hyperkalemia and elevate cr secretion through distal tubule issues but we obviously want to avoid actual nephrotoxicity  from this. We'll finish out 6 weeks of meropenem and 6 weeks of anti-MRSA coverage with Zyvox plus now Bactrim.. I spent greater than 60 minutes with the patient including greater than 50% of time in face to face counsel of the patient Regarding the nature of his endovascular infection and his hardware associated infectionand in coordination of their care.     #2 Osteomyelitis associated with hardware: once he finishes his 6 weeks of IV meropenem along with now Bactrim will have him stop the IV meropenem but continue the oral Bactrim for protracted course for many months until he has had his pins removed and for least 1-2 months after pin removal. Will track his progress by following sedimentation rate  C-reactive proteins and clinical course.  #3 septic emboli to the lungs he is having a repeat CT scan done in coordination with his pulmonologist at [facility]. We will follow up on those images as well.  #4 chronic kidney disease: Creatinine has been stable  #5 drug-induced from a cytopenia labs are repeated today and should hopefully show his platelets recovering from Zyvox induced from a cytopenia

## 2014-08-07 ENCOUNTER — Inpatient Hospital Stay: Payer: BLUE CROSS/BLUE SHIELD | Admitting: Infectious Disease

## 2014-08-14 ENCOUNTER — Telehealth: Payer: Self-pay | Admitting: *Deleted

## 2014-08-14 NOTE — Telephone Encounter (Signed)
Very good thanks Connor Frazier 

## 2014-08-14 NOTE — Telephone Encounter (Signed)
Patty RN with [facility] called to report that patient's creatinine was up from 1.21 to 1.5. Please advise [phone] Wendall MolaJacqueline Cockerham

## 2014-08-14 NOTE — Telephone Encounter (Signed)
I saw that. It is within realm of where his creatinine has been. If they need to adjust dose of his IV meropenem that is fine. I think he is on oral doxycycline. Fine to repeat a creatinine later this week but he DOES have CKD and this seems in range we have seen recently, is he on any diuretics or having any nausea, vomiting or reasons for dehydration?

## 2014-08-14 NOTE — Telephone Encounter (Signed)
Patient is not having any nausea or diarrhea. RN notified to repeat creatinine this week and adjust meropenem if necessary. Connor Frazier

## 2014-08-15 NOTE — Telephone Encounter (Addendum)
Rn from [facility] called and advised the patient Creat went up more to 1.52 on 08/14/14 from 1.5 on 08/12/14 she wanted to let the doctor know.

## 2014-08-15 NOTE — Telephone Encounter (Signed)
Other than renally adjusting his IV antibiotics there is nothing more for ME to do  As he already has chronic kidney disease and THESE changes in cr are most likely related to something else vs background noise. Has [facility] changed his doses or do they even need to?

## 2014-08-16 NOTE — Telephone Encounter (Signed)
They changed the dose and just wanted to let you know. So really just an FYI.

## 2014-08-16 NOTE — Telephone Encounter (Signed)
Ok very good. Thanks Feliz Beamravis!

## 2014-08-19 ENCOUNTER — Telehealth: Payer: Self-pay | Admitting: *Deleted

## 2014-08-19 NOTE — Telephone Encounter (Signed)
Patient will finish 6 week course of IV meropenem tomorrow, 7/19. He is on the oral bactrim DS 1 tablet twice daily.  OK per Dr. Daiva EvesVan Dam to stop antibiotics after last dose 7/19, have PICC removed.  PICC is in chest, patient [facility] to [facility] to have this removed, as it was placed at that facility.  RN unable to schedule there, advised patient if this is his preference he will have to go through the ED or contact that physician (Dr. Silvestre MesiBhusal).  He verbalized understanding. He will continue the bactrim as prescribed, will contact his nephrologist to let him know.  Care team updated with nephrologist information.  Patient's questions answered to his satisfaction. Andree CossHowell, Caliph Borowiak M, RN

## 2014-08-26 ENCOUNTER — Other Ambulatory Visit (HOSPITAL_COMMUNITY): Payer: Self-pay | Admitting: Orthopedic Surgery

## 2014-08-27 ENCOUNTER — Encounter (HOSPITAL_COMMUNITY): Payer: Self-pay | Admitting: *Deleted

## 2014-08-27 MED ORDER — CEFAZOLIN SODIUM-DEXTROSE 2-3 GM-% IV SOLR
2.0000 g | INTRAVENOUS | Status: AC
  Administered 2014-08-28: 2 g via INTRAVENOUS

## 2014-08-28 ENCOUNTER — Telehealth: Payer: Self-pay | Admitting: *Deleted

## 2014-08-28 ENCOUNTER — Ambulatory Visit (HOSPITAL_COMMUNITY): Payer: BLUE CROSS/BLUE SHIELD | Admitting: Anesthesiology

## 2014-08-28 ENCOUNTER — Ambulatory Visit (HOSPITAL_COMMUNITY)
Admission: RE | Admit: 2014-08-28 | Discharge: 2014-08-28 | Disposition: A | Discharge status: Home / Self Care | Payer: BLUE CROSS/BLUE SHIELD | Source: Ambulatory Visit | Attending: Orthopedic Surgery | Admitting: Orthopedic Surgery

## 2014-08-28 ENCOUNTER — Encounter (HOSPITAL_COMMUNITY): Payer: Self-pay | Admitting: *Deleted

## 2014-08-28 ENCOUNTER — Encounter (HOSPITAL_COMMUNITY)
Admission: RE | Disposition: A | Discharge status: Home / Self Care | Payer: Self-pay | Source: Ambulatory Visit | Attending: Orthopedic Surgery

## 2014-08-28 DIAGNOSIS — T847XXS Infection and inflammatory reaction due to other internal orthopedic prosthetic devices, implants and grafts, sequela: Secondary | ICD-10-CM

## 2014-08-28 DIAGNOSIS — Y929 Unspecified place or not applicable: Secondary | ICD-10-CM | POA: Insufficient documentation

## 2014-08-28 DIAGNOSIS — Y838 Other surgical procedures as the cause of abnormal reaction of the patient, or of later complication, without mention of misadventure at the time of the procedure: Secondary | ICD-10-CM | POA: Diagnosis not present

## 2014-08-28 DIAGNOSIS — N183 Chronic kidney disease, stage 3 (moderate): Secondary | ICD-10-CM | POA: Insufficient documentation

## 2014-08-28 DIAGNOSIS — T8484XA Pain due to internal orthopedic prosthetic devices, implants and grafts, initial encounter: Secondary | ICD-10-CM | POA: Diagnosis present

## 2014-08-28 DIAGNOSIS — K219 Gastro-esophageal reflux disease without esophagitis: Secondary | ICD-10-CM | POA: Diagnosis not present

## 2014-08-28 HISTORY — DX: Personal history of diseases of the blood and blood-forming organs and certain disorders involving the immune mechanism: Z86.2

## 2014-08-28 HISTORY — DX: Pneumonia, unspecified organism: J18.9

## 2014-08-28 HISTORY — DX: Chronic kidney disease, unspecified: N18.9

## 2014-08-28 HISTORY — PX: HARDWARE REMOVAL: SHX979

## 2014-08-28 HISTORY — DX: Gastro-esophageal reflux disease without esophagitis: K21.9

## 2014-08-28 HISTORY — DX: Septic arterial embolism: I76

## 2014-08-28 LAB — BASIC METABOLIC PANEL
ANION GAP: 7 (ref 5–15)
BUN: 10 mg/dL (ref 6–20)
CALCIUM: 9.4 mg/dL (ref 8.9–10.3)
CO2: 24 mmol/L (ref 22–32)
Chloride: 109 mmol/L (ref 101–111)
Creatinine, Ser: 1.64 mg/dL — ABNORMAL HIGH (ref 0.61–1.24)
GFR calc non Af Amer: 45 mL/min — ABNORMAL LOW (ref >60)
GFR, EST AFRICAN AMERICAN: 52 mL/min — AB (ref >60)
Glucose, Bld: 89 mg/dL (ref 65–99)
Potassium: 4.4 mmol/L (ref 3.5–5.1)
Sodium: 140 mmol/L (ref 135–145)

## 2014-08-28 LAB — CBC
HCT: 42.8 % (ref 39.0–52.0)
Hemoglobin: 14.1 g/dL (ref 13.0–17.0)
MCH: 28.7 pg (ref 26.0–34.0)
MCHC: 32.9 g/dL (ref 30.0–36.0)
MCV: 87 fL (ref 78.0–100.0)
Platelets: 166 10*3/uL (ref 150–400)
RBC: 4.92 MIL/uL (ref 4.22–5.81)
RDW: 14.4 % (ref 11.5–15.5)
WBC: 5 10*3/uL (ref 4.0–10.5)

## 2014-08-28 LAB — SURGICAL PCR SCREEN
MRSA, PCR: NEGATIVE
STAPHYLOCOCCUS AUREUS: NEGATIVE

## 2014-08-28 SURGERY — REMOVAL, HARDWARE
Anesthesia: General | Site: Ankle | Laterality: Right

## 2014-08-28 MED ORDER — MUPIROCIN 2 % EX OINT
TOPICAL_OINTMENT | CUTANEOUS | Status: AC
  Administered 2014-08-28: 1 via TOPICAL
  Filled 2014-08-28: qty 22

## 2014-08-28 MED ORDER — MIDAZOLAM HCL 5 MG/5ML IJ SOLN
INTRAMUSCULAR | Status: DC | PRN
  Administered 2014-08-28: 2 mg via INTRAVENOUS

## 2014-08-28 MED ORDER — FENTANYL CITRATE (PF) 250 MCG/5ML IJ SOLN
INTRAMUSCULAR | Status: AC
  Filled 2014-08-28: qty 5

## 2014-08-28 MED ORDER — CEFAZOLIN SODIUM-DEXTROSE 2-3 GM-% IV SOLR
INTRAVENOUS | Status: AC
  Filled 2014-08-28: qty 50

## 2014-08-28 MED ORDER — ONDANSETRON HCL 4 MG/2ML IJ SOLN
INTRAMUSCULAR | Status: DC | PRN
  Administered 2014-08-28: 4 mg via INTRAVENOUS

## 2014-08-28 MED ORDER — PROPOFOL 10 MG/ML IV BOLUS
INTRAVENOUS | Status: DC | PRN
  Administered 2014-08-28: 150 mg via INTRAVENOUS

## 2014-08-28 MED ORDER — FENTANYL CITRATE (PF) 100 MCG/2ML IJ SOLN
INTRAMUSCULAR | Status: DC | PRN
  Administered 2014-08-28 (×2): 50 ug via INTRAVENOUS

## 2014-08-28 MED ORDER — PHENYLEPHRINE HCL 10 MG/ML IJ SOLN
INTRAMUSCULAR | Status: DC | PRN
  Administered 2014-08-28: 80 ug via INTRAVENOUS

## 2014-08-28 MED ORDER — MUPIROCIN 2 % EX OINT
1.0000 "application " | TOPICAL_OINTMENT | Freq: Once | CUTANEOUS | Status: AC
  Administered 2014-08-28: 1 via TOPICAL

## 2014-08-28 MED ORDER — HYDROMORPHONE HCL 1 MG/ML IJ SOLN: 0.2500 mg | INTRAMUSCULAR | Status: DC | PRN

## 2014-08-28 MED ORDER — CHLORHEXIDINE GLUCONATE 4 % EX LIQD: 60.0000 mL | Freq: Once | CUTANEOUS | Status: DC

## 2014-08-28 MED ORDER — SODIUM CHLORIDE 0.9 % IV SOLN
INTRAVENOUS | Status: DC
  Administered 2014-08-28: 10:00:00 via INTRAVENOUS

## 2014-08-28 MED ORDER — MIDAZOLAM HCL 2 MG/2ML IJ SOLN
INTRAMUSCULAR | Status: AC
  Filled 2014-08-28: qty 2

## 2014-08-28 MED ORDER — LIDOCAINE HCL (CARDIAC) 20 MG/ML IV SOLN
INTRAVENOUS | Status: DC | PRN
  Administered 2014-08-28: 50 mg via INTRAVENOUS

## 2014-08-28 SURGICAL SUPPLY — 33 items
BANDAGE ESMARK 6X9 LF (GAUZE/BANDAGES/DRESSINGS) ×1 IMPLANT
BNDG COHESIVE 4X5 TAN STRL (GAUZE/BANDAGES/DRESSINGS) ×3 IMPLANT
BNDG ESMARK 6X9 LF (GAUZE/BANDAGES/DRESSINGS) ×3
BNDG GAUZE ELAST 4 BULKY (GAUZE/BANDAGES/DRESSINGS) ×3 IMPLANT
COVER SURGICAL LIGHT HANDLE (MISCELLANEOUS) ×6 IMPLANT
CUFF TOURNIQUET SINGLE 34IN LL (TOURNIQUET CUFF) IMPLANT
CUFF TOURNIQUET SINGLE 44IN (TOURNIQUET CUFF) IMPLANT
DRAPE U-SHAPE 47X51 STRL (DRAPES) ×3 IMPLANT
DRSG ADAPTIC 3X8 NADH LF (GAUZE/BANDAGES/DRESSINGS) ×3 IMPLANT
DRSG EMULSION OIL 3X3 NADH (GAUZE/BANDAGES/DRESSINGS) ×3 IMPLANT
DRSG PAD ABDOMINAL 8X10 ST (GAUZE/BANDAGES/DRESSINGS) ×3 IMPLANT
DURAPREP 26ML APPLICATOR (WOUND CARE) ×3 IMPLANT
ELECT REM PT RETURN 9FT ADLT (ELECTROSURGICAL) ×3
ELECTRODE REM PT RTRN 9FT ADLT (ELECTROSURGICAL) ×1 IMPLANT
GAUZE SPONGE 4X4 12PLY STRL (GAUZE/BANDAGES/DRESSINGS) ×3 IMPLANT
GLOVE BIOGEL PI IND STRL 9 (GLOVE) ×1 IMPLANT
GLOVE BIOGEL PI INDICATOR 9 (GLOVE) ×2
GLOVE SURG ORTHO 9.0 STRL STRW (GLOVE) ×3 IMPLANT
GOWN STRL REUS W/ TWL XL LVL3 (GOWN DISPOSABLE) ×3 IMPLANT
GOWN STRL REUS W/TWL XL LVL3 (GOWN DISPOSABLE) ×6
KIT BASIN OR (CUSTOM PROCEDURE TRAY) ×3 IMPLANT
KIT ROOM TURNOVER OR (KITS) ×3 IMPLANT
MANIFOLD NEPTUNE II (INSTRUMENTS) ×3 IMPLANT
NS IRRIG 1000ML POUR BTL (IV SOLUTION) ×3 IMPLANT
PACK ORTHO EXTREMITY (CUSTOM PROCEDURE TRAY) ×3 IMPLANT
PAD ARMBOARD 7.5X6 YLW CONV (MISCELLANEOUS) ×6 IMPLANT
PADDING CAST COTTON 6X4 STRL (CAST SUPPLIES) ×3 IMPLANT
SPONGE GAUZE 4X4 12PLY STER LF (GAUZE/BANDAGES/DRESSINGS) ×3 IMPLANT
STOCKINETTE IMPERVIOUS 9X36 MD (GAUZE/BANDAGES/DRESSINGS) IMPLANT
TOWEL OR 17X24 6PK STRL BLUE (TOWEL DISPOSABLE) ×3 IMPLANT
TOWEL OR 17X26 10 PK STRL BLUE (TOWEL DISPOSABLE) ×3 IMPLANT
UNDERPAD 30X30 INCONTINENT (UNDERPADS AND DIAPERS) ×3 IMPLANT
WATER STERILE IRR 1000ML POUR (IV SOLUTION) ×3 IMPLANT

## 2014-08-28 NOTE — Anesthesia Procedure Notes (Signed)
Procedure Name: LMA Insertion Date/Time: 08/28/2014 11:13 AM Performed by: Carmela Rima Pre-anesthesia Checklist: Timeout performed, Patient being monitored, Suction available, Emergency Drugs available and Patient identified Patient Re-evaluated:Patient Re-evaluated prior to inductionOxygen Delivery Method: Circle system utilized Preoxygenation: Pre-oxygenation with 100% oxygen Intubation Type: IV induction Ventilation: Mask ventilation without difficulty LMA: LMA inserted LMA Size: 4.0 Placement Confirmation: breath sounds checked- equal and bilateral and positive ETCO2 Tube secured with: Tape Dental Injury: Teeth and Oropharynx as per pre-operative assessment

## 2014-08-28 NOTE — Op Note (Signed)
08/28/2014  11:34 AM  PATIENT:  Connor Frazier    PRE-OPERATIVE DIAGNOSIS:  Painful Hardware Medial Right Ankle  POST-OPERATIVE DIAGNOSIS:  Same  PROCEDURE:  Removal Deep Hardware Right Ankle  SURGEON:  Nadara Mustard, MD  PHYSICIAN ASSISTANT:None ANESTHESIA:   General  PREOPERATIVE INDICATIONS:  Connor Frazier is a  59 y.o. male with a diagnosis of Painful Hardware Medial Right Ankle who failed conservative measures and elected for surgical management.    The risks benefits and alternatives were discussed with the patient preoperatively including but not limited to the risks of infection, bleeding, nerve injury, cardiopulmonary complications, the need for revision surgery, among others, and the patient was willing to proceed.  OPERATIVE IMPLANTS: None  OPERATIVE FINDINGS: No purulence no signs of infection  OPERATIVE PROCEDURE: Patient was brought to the operating room and underwent a general and aesthetic. After adequate levels anesthesia were obtained patient's right lower extremity was prepped using DuraPrep draped into a sterile field. A timeout was called. A small incision was made over his previous medial incision. This was carried sharply down to the 2 screws. The screws were removed without complications. The wound was irrigated with normal saline. The incision closed using 2-0 nylon. A sterile compressive dressing was applied. Patient was extubated taken to the PACU in stable condition. Plan for discharge to home.

## 2014-08-28 NOTE — Anesthesia Postprocedure Evaluation (Signed)
  Anesthesia Post-op Note  Patient: Connor Frazier  Procedure(s) Performed: Procedure(s): Removal Deep Hardware Right Ankle (Right)  Patient Location: PACU  Anesthesia Type:General  Level of Consciousness: awake and alert   Airway and Oxygen Therapy: Patient Spontanous Breathing  Post-op Pain: Controlled  Post-op Assessment: Post-op Vital signs reviewed, Patient's Cardiovascular Status Stable and Respiratory Function Stable  Post-op Vital Signs: Reviewed  Filed Vitals:   08/28/14 1243  BP: 125/71  Pulse: 58  Temp:   Resp: 18    Complications: No apparent anesthesia complications

## 2014-08-28 NOTE — Telephone Encounter (Signed)
Will request labs be sent to [facility] from Dr Lequita Halt [facility], [phone]). Thanks!

## 2014-08-28 NOTE — Telephone Encounter (Signed)
Clinic doc

## 2014-08-28 NOTE — Telephone Encounter (Signed)
Dr Daiva Eves is inside for the next two weeks, will not be in clinic. I'm cc'ing Minh who spoke with Dr. Daiva Eves regarding alternatives.

## 2014-08-28 NOTE — Transfer of Care (Signed)
Immediate Anesthesia Transfer of Care Note  Patient: Connor Frazier  Procedure(s) Performed: Procedure(s): Removal Deep Hardware Right Ankle (Right)  Patient Location: PACU  Anesthesia Type:General  Level of Consciousness: awake, alert  and oriented  Airway & Oxygen Therapy: Patient Spontanous Breathing and Patient connected to nasal cannula oxygen  Post-op Assessment: Report given to RN and Post -op Vital signs reviewed and stable  Post vital signs: Reviewed and stable  Last Vitals:  Filed Vitals:   08/28/14 0926  BP: 140/75  Pulse: 77  Temp: 36.5 C  Resp: 20    Complications: No apparent anesthesia complications

## 2014-08-28 NOTE — Progress Notes (Signed)
Okay to use PICC line per Dr. Lita Mains. Fitzgerald, flushes easily but unable to draw blood, lab aware.

## 2014-08-28 NOTE — Anesthesia Preprocedure Evaluation (Addendum)
Anesthesia Evaluation  Patient identified by MRN, date of birth, ID band Patient awake    Reviewed: Allergy & Precautions, H&P , NPO status , Patient's Chart, lab work & pertinent test results  History of Anesthesia Complications (+) PONV and history of anesthetic complications  Airway Mallampati: II  TM Distance: >3 FB Neck ROM: Full    Dental no notable dental hx. (+) Teeth Intact, Dental Advisory Given   Pulmonary neg pulmonary ROS,  breath sounds clear to auscultation  Pulmonary exam normal       Cardiovascular negative cardio ROS  Rhythm:Regular Rate:Normal     Neuro/Psych negative neurological ROS  negative psych ROS   GI/Hepatic Neg liver ROS, GERD-  Controlled,  Endo/Other  negative endocrine ROS  Renal/GU Renal InsufficiencyRenal disease  negative genitourinary   Musculoskeletal   Abdominal   Peds  Hematology negative hematology ROS (+)   Anesthesia Other Findings   Reproductive/Obstetrics negative OB ROS                           Anesthesia Physical Anesthesia Plan  ASA: II  Anesthesia Plan: General   Post-op Pain Management:    Induction: Intravenous  Airway Management Planned: LMA  Additional Equipment:   Intra-op Plan:   Post-operative Plan: Extubation in OR  Informed Consent: I have reviewed the patients History and Physical, chart, labs and discussed the procedure including the risks, benefits and alternatives for the proposed anesthesia with the patient or authorized representative who has indicated his/her understanding and acceptance.   Dental advisory given and Dental Advisory Given  Plan Discussed with: CRNA and Anesthesiologist  Anesthesia Plan Comments:        Anesthesia Quick Evaluation

## 2014-08-28 NOTE — Telephone Encounter (Signed)
Dr. Lequita Halt, nephrology, is calling to speak with Dr. Daiva Eves regarding recent labs, antibiotic therapy. Dr. Daiva Eves is unavailable this week, RN offered for [facility], pharmacist to speak with Dr. Lequita Halt.  He preferred to speak with the covering provider. RN gave Dr. Jamie Kato office Dr. Moshe Cipro pager. Andree Coss, RN

## 2014-08-28 NOTE — Telephone Encounter (Signed)
Pt's Cr has increased while on bactrim.  I conferred with MD and will stop bactrim.  Please have pt f/u with Dr Daiva Eves next week

## 2014-08-28 NOTE — H&P (Signed)
Connor Frazier is an 59 y.o. male.   Chief Complaint: Painful deep retained medial malleolar hardware right ankle HPI: Patient is a 59 year old gentleman status post open reduction internal fixation for right ankle fracture. Patient previously had removal of the lateral hardware due to infection. Patient presents at this time for removal of the medial hardware due to pain.  Past Medical History  Diagnosis Date  . PONV (postoperative nausea and vomiting)   . Elevated serum creatinine   . Hypercholesterolemia   . Chronic kidney disease     stage III- sees neph at [facility]  . GERD (gastroesophageal reflux disease)     takes probiotics  . H/O cytopenia     Medication induced  . Septic embolism   . Lung infection 2016    Past Surgical History  Procedure Laterality Date  . Appendectomy    . Colonoscopy    . Dental surgery      implants   . Orif ankle fracture Right 05/24/2014    Procedure: OPEN REDUCTION INTERNAL FIXATION (ORIF) ANKLE FRACTURE;  Surgeon: Nadara Mustard, MD;  Location: [facility] OR;  Service: Orthopedics;  Laterality: Right;  . Irrigation and debridement abscess Right 05/29/2014    rt ankle   . I&d extremity Right 05/29/2014    Procedure: IRRIGATION AND DEBRIDEMENT RIGHT ANKLE AND PLACE ANTIBIOTIC BEADS;  Surgeon: Nadara Mustard, MD;  Location: [facility] OR;  Service: Orthopedics;  Laterality: Right;  . Hardware removal Right 06/21/2014    Procedure: Removal Hardware Right Ankle, Place Antibiotic Beads, and Wound VAC;  Surgeon: Nadara Mustard, MD;  Location: [facility] OR;  Service: Orthopedics;  Laterality: Right;  . Peripherally inserted central catheter insertion    . Picc line removal ([facility] hx)    . Peripherally inserted central catheter insertion      Family History  Problem Relation Age of Onset  . Dementia Mother   . Diabetes Father   . Heart disease Father   . Cancer - Prostate Father    Social History:  reports that he has never smoked. He has never used smokeless  tobacco. He reports that he drinks alcohol. He reports that he does not use illicit drugs.  Allergies:  Allergies  Allergen Reactions  . Other Nausea And Vomiting    ANAESTHESIA MAKES PT VERY SICK ON STOMACH    No prescriptions prior to admission    No results found for this or any previous visit (from the past 48 hour(s)). No results found.  Review of Systems  All other systems reviewed and are negative.   There were no vitals taken for this visit. Physical Exam  On examination patient's ankle has no redness no swelling he has good pulses the retained hardware medially is painful to palpation. Assessment/Plan Assessment: Painful deep retained hardware medial malleolus right ankle.  Plan: We will plan for removal of deep retained hardware. Risk and benefits were discussed including infection neurovascular injury persistent pain and need for additional surgery. Patient states he understands and wishes to proceed at this time.  DUDA,MARCUS V 08/28/2014, 6:47 AM

## 2014-08-29 ENCOUNTER — Other Ambulatory Visit: Payer: BLUE CROSS/BLUE SHIELD | Admitting: Infectious Diseases

## 2014-08-29 ENCOUNTER — Telehealth: Payer: Self-pay | Admitting: Pharmacist Clinician (PhC)/ Clinical Pharmacy Specialist

## 2014-08-29 ENCOUNTER — Encounter (HOSPITAL_COMMUNITY): Payer: Self-pay | Admitting: Orthopedic Surgery

## 2014-08-29 MED ORDER — DOXYCYCLINE HYCLATE 100 MG PO TABS: 100.0000 mg | ORAL_TABLET | Freq: Two times a day (BID) | ORAL | Status: DC

## 2014-08-29 NOTE — Telephone Encounter (Signed)
A telephone note from 08/19/2014 indicates that Dr. Daiva Eves wanted him to stop both IV meropenem and oral trimethoprim sulfamethoxazole on 08/20/2014. Please confirm that he is no longer taking his antibiotics. Dr. Lajoyce Corners removed 2 screws from his left ankle yesterday. The operative note gave no indication of persistent infection there and he has had 6 weeks of empiric anabiotic therapy for presumed septic pulmonary emboli.

## 2014-08-29 NOTE — Telephone Encounter (Signed)
Pt has finished off 6 wks of a combo of Merrem + either zyvox or bactrim. Nephrology was afraid of using septra due to his scr. The most recent on is slightly higher at 1.64. He has stopped the septra. The hardware in his ankle has been taken out. Will clarify with Dr. Zenaida Niece dam to see if abx is still needed at this point.

## 2014-08-29 NOTE — Progress Notes (Signed)
Called by pt's nephrologist that his Cr was increasing while on bactrim. He requested we stop this.  Will try to get him in with Dr Daiva Eves in f/u.  Will change his medications to doxy.  Will kindly ask that nurses call him and let him know of this change.

## 2014-09-01 NOTE — Telephone Encounter (Signed)
I agree with switch to oral doxy if his cr had risen on bactrim

## 2014-09-02 NOTE — Telephone Encounter (Signed)
Confirmed that patient stopped the bactrim, he will pick up doxycycline and start it today.  Confirmed follow up appointment with Dr. Daiva Eves 8/22. Thanks!

## 2014-09-02 NOTE — Telephone Encounter (Signed)
Excellent

## 2014-09-04 ENCOUNTER — Telehealth: Payer: Self-pay | Admitting: Pharmacist Clinician (PhC)/ Clinical Pharmacy Specialist

## 2014-09-04 NOTE — Telephone Encounter (Signed)
Spoke with Dr. Daiva Eves about his case. He recommended to cont doxy for another month. Hardware has been removed. Call him and told him of the plan.

## 2014-09-17 ENCOUNTER — Encounter: Payer: Self-pay | Admitting: Physical Therapy

## 2014-09-17 ENCOUNTER — Ambulatory Visit: Payer: BLUE CROSS/BLUE SHIELD | Attending: Orthopedic Surgery | Admitting: Physical Therapy

## 2014-09-17 ENCOUNTER — Encounter: Payer: Self-pay | Admitting: Infectious Disease

## 2014-09-17 DIAGNOSIS — R269 Unspecified abnormalities of gait and mobility: Secondary | ICD-10-CM | POA: Insufficient documentation

## 2014-09-17 DIAGNOSIS — R262 Difficulty in walking, not elsewhere classified: Secondary | ICD-10-CM | POA: Insufficient documentation

## 2014-09-17 DIAGNOSIS — M259 Joint disorder, unspecified: Secondary | ICD-10-CM | POA: Insufficient documentation

## 2014-09-17 DIAGNOSIS — R29898 Other symptoms and signs involving the musculoskeletal system: Secondary | ICD-10-CM

## 2014-09-17 DIAGNOSIS — M25671 Stiffness of right ankle, not elsewhere classified: Secondary | ICD-10-CM | POA: Diagnosis present

## 2014-09-17 DIAGNOSIS — M25561 Pain in right knee: Secondary | ICD-10-CM | POA: Insufficient documentation

## 2014-09-17 NOTE — Therapy (Signed)
[facility] [address]  [address] [place], [place], [address] Phone: [phone]   Fax:  [phone]  Physical Therapy Evaluation  Patient Details  Name: Connor Frazier MRN: [id number] Date of Birth: [birth year] Referring Provider:  Nadara Mustard, MD  Encounter Date: 09/17/2014      PT End of Session - 09/17/14 1411    Visit Number 1   Number of Visits 12   Date for PT Re-Evaluation 10/30/14   PT Start Time 1358   PT Stop Time 1452   PT Time Calculation (min) 54 min      Past Medical History  Diagnosis Date  . PONV (postoperative nausea and vomiting)   . Elevated serum creatinine   . Hypercholesterolemia   . Chronic kidney disease     stage III- sees neph at [facility]  . GERD (gastroesophageal reflux disease)     takes probiotics  . H/O cytopenia     Medication induced  . Septic embolism   . Lung infection 2016    Past Surgical History  Procedure Laterality Date  . Appendectomy    . Colonoscopy    . Dental surgery      implants   . Orif ankle fracture Right 05/24/2014    Procedure: OPEN REDUCTION INTERNAL FIXATION (ORIF) ANKLE FRACTURE;  Surgeon: Nadara Mustard, MD;  [facility];  Service: Orthopedics;  Laterality: Right;  . Irrigation and debridement abscess Right 05/29/2014    rt ankle   . I&d extremity Right 05/29/2014    Procedure: IRRIGATION AND DEBRIDEMENT RIGHT ANKLE AND PLACE ANTIBIOTIC BEADS;  Surgeon: Nadara Mustard, MD;  [facility];  Service: Orthopedics;  Laterality: Right;  . Hardware removal Right 06/21/2014    Procedure: Removal Hardware Right Ankle, Place Antibiotic Beads, and Wound VAC;  Surgeon: Nadara Mustard, MD;  [facility]: [facility];  Service: Orthopedics;  Laterality: Right;  . Peripherally inserted central catheter insertion    . Picc line removal ([facility] hx)    . Peripherally inserted central catheter insertion    . Hardware removal Right 08/28/2014    Procedure:  Removal Deep Hardware Right Ankle;  Surgeon: Nadara Mustard, MD;  Location: [facility] OR;  Service: Orthopedics;  Laterality: Right;    There were no vitals filed for this visit.  Visit Diagnosis:  Difficulty walking - Plan: PT plan of care cert/re-cert  Ankle weakness - Plan: PT plan of care cert/re-cert  Weakness of right hip - Plan: PT plan of care cert/re-cert  Abnormality of gait - Plan: PT plan of care cert/re-cert  Ankle stiffness, right - Plan: PT plan of care cert/re-cert  Right knee pain - Plan: PT plan of care cert/re-cert      Subjective Assessment - 09/17/14 1404    Subjective pt exp'd R ankle fx and dislocation 05/21/14 during martial arts training.  Underwent ORIF on 05/24/14 which became infected.  Pt underwent surgical debridement on 05/29/14 and again on 06/21/14 (this time with hardware removal).   Several days later he was found to have septic embolism to lung and was hospitalized x 8 days (discharged home June 15).  Most recently had last 2 screws removed on 08/28/14 and no infection noted at that time.  Pt participated in HHPT from mid July until last week.  Currently WBAT with use of B axillary crutches.     Patient Stated Goals be able to run and jump and participate in martial arts  Currently in Pain? No/denies            [facility] PT Assessment - 09/17/14 1400    Assessment   Medical Diagnosis s/p R ankle ORIF and hardware removal   Onset Date/Surgical Date 05/29/14   Balance Screen   Has the patient fallen in the past 6 months No   Has the patient had a decrease in activity level because of a fear of falling?  No   Is the patient reluctant to leave their home because of a fear of falling?  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   Leisure enjoys [employer] (was participating 3x/wk prior to injury) - hopes to return to this.   Observation/Other  Assessments   Focus on Therapeutic Outcomes (FOTO)  66% limitation   Functional Tests   Functional tests Squat   Squat   Comments large wt shift to L, able to achieve 80% parallel without c/o ankle pain   PROM   PROM Assessment Site Ankle   Right/Left Ankle Right  IV 20, EV 15, DF 10, PF 55   Strength   Strength Assessment Site Hip   Right/Left Hip Right   Right Hip Flexion 4+/5   Right Hip Extension 4/5   Right Hip ABduction 4/5   Right Hip ADduction 5/5   Right/Left Knee Right   Right Knee Flexion 5/5   Right Knee Extension 5/5   Right/Left Ankle Right   Right Ankle Dorsiflexion 4/5   Right Ankle Plantar Flexion 2+/5   Right Ankle Inversion 4/5   Right Ankle Eversion 4/5       TODAY'S TREATMENT TherEx -  Bridge 10x Bridge with March 10x SL Bridge 10x (difficult) Attempted side Bridge on R but unable due to ABD weakness so performed Seated Hip ABD Black TB 15x Seated HS stretch            PT Education - 09/18/14 0905    Education provided Yes   Education Details Initial HEP   Person(s) Educated Patient   Methods Explanation;Demonstration;Handout   Comprehension Verbalized understanding;Returned demonstration          PT Short Term Goals - 09/18/14 0907    PT SHORT TERM GOAL #1   Title pt independent in intial HEP by 09/27/14   Status New           PT Long Term Goals - 09/18/14 0908    PT LONG TERM GOAL #1   Title pt able to return to regular exercise and participation in martial arts training by 10/30/14   Status New   PT LONG TERM GOAL #2   Title pt able to jog up to 5 minutes and hop without limitation by weakness or pain by 10/30/14   Status New   PT LONG TERM GOAL #3   Title pt displays R Hip and Ankle MMT 4+/5 or better grossly by 10/30/14   Status New               Plan - 09/17/14 1500    Clinical Impression Statement pt s/p R ankle fx and ORIF which was followed by extensive infection requiring hardware removal and several  days of hospitalization.  Pt recently progressed to WBAT with use of B axillary crutches. Pt with weakness throughout R hip and ankle along with mild stiffness in R ankle and B hamstring tightness.  He  requires B axillary crutch use for ambulation at this time due to extent of weakness preventing normal gait mechanics.  Pt with recent onset of R medial knee pain which he believes is due to walking with boot on ankle and altered gait mechanics which is probably an accurate assessment.  Pain is in anterior aspect of medial joint line and seems likely plica at this time but could be medial meniscus in nature.  Either way, pain is not intense at this time but we will monitor and address as necessary. Pt very active prior to injury and is hoping to return to martial arts in the future.  Other than    Pt will benefit from skilled therapeutic intervention in order to improve on the following deficits Abnormal gait;Decreased range of motion;Difficulty walking;Pain;Impaired flexibility;Decreased strength;Hypomobility;Decreased balance   Rehab Potential Good   PT Frequency 2x / week   PT Duration 6 weeks   PT Treatment/Interventions Therapeutic exercise;Therapeutic activities;Balance training;Manual techniques;Neuromuscular re-education;Gait training;Stair training;Taping;Vasopneumatic Device;Electrical Stimulation   PT Next Visit Plan ankle ROM, HS stretching, hip and ankle stability exercises, balance and gait training, manual, modalities, taping PRN to ankle and/or knee   Consulted and Agree with Plan of Care Patient         Problem List Patient Active Problem List   Diagnosis Date Noted  . Septic embolism 08/06/2014  . Chronic kidney disease (CKD), stage II (mild) 07/10/2014  . Bone infection 07/10/2014  . Lung nodule, multiple 07/10/2014  . CKD (chronic kidney disease)   . CKD (chronic kidney disease) stage 3, GFR 30-59 ml/min 06/20/2014  . Hardware complicating wound infection 06/19/2014  . Wound  infection complicating hardware 05/29/2014  . Trimalleolar fracture of ankle, closed 05/24/2014    Kasumi Ditullio PT, OCS 09/18/2014, 9:25 AM  [facility] [address]  [address] [place], [place], [address] Phone: [phone]   Fax:  [phone]

## 2014-09-19 ENCOUNTER — Ambulatory Visit: Payer: BLUE CROSS/BLUE SHIELD | Admitting: Rehabilitation

## 2014-09-19 DIAGNOSIS — M25671 Stiffness of right ankle, not elsewhere classified: Secondary | ICD-10-CM

## 2014-09-19 DIAGNOSIS — R269 Unspecified abnormalities of gait and mobility: Secondary | ICD-10-CM

## 2014-09-19 DIAGNOSIS — R262 Difficulty in walking, not elsewhere classified: Secondary | ICD-10-CM | POA: Diagnosis not present

## 2014-09-19 DIAGNOSIS — R29898 Other symptoms and signs involving the musculoskeletal system: Secondary | ICD-10-CM

## 2014-09-19 DIAGNOSIS — M25561 Pain in right knee: Secondary | ICD-10-CM

## 2014-09-19 NOTE — Therapy (Signed)
[facility] Outpatient Rehabilitation [facility] [address]  [address] [place], [place], [address] Phone: [phone]   Fax:  [phone]  Physical Therapy Treatment  Patient Details  Name: Connor Frazier MRN: [id number] Date of Birth: [birth year] Referring Provider:  Nadara Mustard, MD  Encounter Date: 09/19/2014      PT End of Session - 09/19/14 1439    Visit Number 2   Number of Visits 12   Date for PT Re-Evaluation 10/30/14   PT Start Time 1435   PT Stop Time 1525   PT Time Calculation (min) 50 min      Past Medical History  Diagnosis Date  . PONV (postoperative nausea and vomiting)   . Elevated serum creatinine   . Hypercholesterolemia   . Chronic kidney disease     stage III- sees neph at [facility]  . GERD (gastroesophageal reflux disease)     takes probiotics  . H/O cytopenia     Medication induced  . Septic embolism   . Lung infection 2016    Past Surgical History  Procedure Laterality Date  . Appendectomy    . Colonoscopy    . Dental surgery      implants   . Orif ankle fracture Right 05/24/2014    Procedure: OPEN REDUCTION INTERNAL FIXATION (ORIF) ANKLE FRACTURE;  Surgeon: Nadara Mustard, MD;  Location: [facility] OR;  Service: Orthopedics;  Laterality: Right;  . Irrigation and debridement abscess Right 05/29/2014    rt ankle   . I&d extremity Right 05/29/2014    Procedure: IRRIGATION AND DEBRIDEMENT RIGHT ANKLE AND PLACE ANTIBIOTIC BEADS;  Surgeon: Nadara Mustard, MD;  Location: [facility] OR;  Service: Orthopedics;  Laterality: Right;  . Hardware removal Right 06/21/2014    Procedure: Removal Hardware Right Ankle, Place Antibiotic Beads, and Wound VAC;  Surgeon: Nadara Mustard, MD;  Location: [facility] OR;  Service: Orthopedics;  Laterality: Right;  . Peripherally inserted central catheter insertion    . Picc line removal ([facility] hx)    . Peripherally inserted central catheter insertion    . Hardware removal Right 08/28/2014    Procedure: Removal  Deep Hardware Right Ankle;  Surgeon: Nadara Mustard, MD;  Location: [facility] OR;  Service: Orthopedics;  Laterality: Right;    There were no vitals filed for this visit.  Visit Diagnosis:  Ankle weakness  Weakness of right hip  Difficulty walking  Abnormality of gait  Ankle stiffness, right  Right knee pain      Subjective Assessment - 09/19/14 1439    Subjective Reports complaince with HEP and states that he is trying to do more of the difficult onces. Reports no complaints of pain currently.    Currently in Pain? No/denies      TODAY'S TREATMENT TherEx -  Nustep level 4x4' Seated BAPS level 3 DF/PF, IV/EV 10x then 2.5# at AM and AL for DF/PF, EV/IV 10x each/ each position Seated 4 way ankle Black TB 10x SL Bridge 10x Lt Side-Lying Rt Clam ER Black TB 15x Lt Side-Lying Rt Abduction 3# 15x Prone Rt SLR 3# 15x Rt SLR 3# Bil Hamstring Stretch  Weight shifting on Blue Foam side/side 10x, front/back 10x both with 2 pole assist DF Rocker stretch 3x20" TRX Squats 15x with minimal cues for weight shift DL Heel raise with chair support 15x Rt Toe Extension ROM 10x5"        PT Short Term Goals - 09/19/14 1442    PT SHORT  TERM GOAL #1   Title pt independent in intial HEP by 09/27/14   Status On-going           PT Long Term Goals - 09/19/14 1442    PT LONG TERM GOAL #1   Title pt able to return to regular exercise and participation in martial arts training by 10/30/14   Status On-going   PT LONG TERM GOAL #2   Title pt able to jog up to 5 minutes and hop without limitation by weakness or pain by 10/30/14   Status On-going   PT LONG TERM GOAL #3   Title pt displays R Hip and Ankle MMT 4+/5 or better grossly by 10/30/14   Status On-going               Plan - 09/19/14 1526    Clinical Impression Statement Good tolerance to all exercises with no pain just muscle fatigue per pt. Needed minimal verbal cues for TRX squats today with pt able to correct easily. Performed  toe extension ROM exercise due to pt noting increased stiffness with heel raises.    PT Next Visit Plan Continue with ankle ROM, HS stretching, hip and ankle stability, balance and gait, manual and modalities as needed.    Consulted and Agree with Plan of Care Patient        Problem List Patient Active Problem List   Diagnosis Date Noted  . Septic embolism 08/06/2014  . Chronic kidney disease (CKD), stage II (mild) 07/10/2014  . Bone infection 07/10/2014  . Lung nodule, multiple 07/10/2014  . CKD (chronic kidney disease)   . CKD (chronic kidney disease) stage 3, GFR 30-59 ml/min 06/20/2014  . Hardware complicating wound infection 06/19/2014  . Wound infection complicating hardware 05/29/2014  . Trimalleolar fracture of ankle, closed 05/24/2014    Nestor Ramp [place], [place] 09/19/2014, 3:29 PM  [facility] [address]  [address] [place], [place], [address] Phone: [phone]   Fax:  [phone]

## 2014-09-23 ENCOUNTER — Encounter: Payer: Self-pay | Admitting: Infectious Disease

## 2014-09-23 ENCOUNTER — Ambulatory Visit (INDEPENDENT_AMBULATORY_CARE_PROVIDER_SITE_OTHER): Payer: BLUE CROSS/BLUE SHIELD | Admitting: Infectious Disease

## 2014-09-23 VITALS — BP 140/91 | HR 75 | Temp 98.1°F | Wt 210.0 lb

## 2014-09-23 DIAGNOSIS — T847XXD Infection and inflammatory reaction due to other internal orthopedic prosthetic devices, implants and grafts, subsequent encounter: Secondary | ICD-10-CM | POA: Diagnosis not present

## 2014-09-23 DIAGNOSIS — I76 Septic arterial embolism: Secondary | ICD-10-CM

## 2014-09-23 DIAGNOSIS — I330 Acute and subacute infective endocarditis: Secondary | ICD-10-CM | POA: Diagnosis not present

## 2014-09-23 DIAGNOSIS — I2690 Septic pulmonary embolism without acute cor pulmonale: Secondary | ICD-10-CM

## 2014-09-23 DIAGNOSIS — N183 Chronic kidney disease, stage 3 unspecified: Secondary | ICD-10-CM

## 2014-09-23 DIAGNOSIS — K760 Fatty (change of) liver, not elsewhere classified: Secondary | ICD-10-CM | POA: Diagnosis not present

## 2014-09-23 DIAGNOSIS — I2510 Atherosclerotic heart disease of native coronary artery without angina pectoris: Secondary | ICD-10-CM | POA: Diagnosis not present

## 2014-09-23 DIAGNOSIS — I38 Endocarditis, valve unspecified: Secondary | ICD-10-CM

## 2014-09-23 DIAGNOSIS — M869 Osteomyelitis, unspecified: Secondary | ICD-10-CM

## 2014-09-23 DIAGNOSIS — R918 Other nonspecific abnormal finding of lung field: Secondary | ICD-10-CM

## 2014-09-23 HISTORY — DX: Atherosclerotic heart disease of native coronary artery without angina pectoris: I25.10

## 2014-09-23 HISTORY — DX: Endocarditis, valve unspecified: I38

## 2014-09-23 HISTORY — DX: Fatty (change of) liver, not elsewhere classified: K76.0

## 2014-09-23 HISTORY — DX: Acute and subacute infective endocarditis: I33.0

## 2014-09-23 LAB — CBC WITH DIFFERENTIAL/PLATELET
BASOS PCT: 0 % (ref 0–1)
Basophils Absolute: 0 10*3/uL (ref 0.0–0.1)
Eosinophils Absolute: 0.2 10*3/uL (ref 0.0–0.7)
Eosinophils Relative: 2 % (ref 0–5)
HCT: 44.6 % (ref 39.0–52.0)
HEMOGLOBIN: 14.9 g/dL (ref 13.0–17.0)
Lymphocytes Relative: 26 % (ref 12–46)
Lymphs Abs: 2 10*3/uL (ref 0.7–4.0)
MCH: 28.4 pg (ref 26.0–34.0)
MCHC: 33.4 g/dL (ref 30.0–36.0)
MCV: 85 fL (ref 78.0–100.0)
MPV: 10.3 fL (ref 8.6–12.4)
Monocytes Absolute: 0.8 10*3/uL (ref 0.1–1.0)
Monocytes Relative: 11 % (ref 3–12)
NEUTROS ABS: 4.6 10*3/uL (ref 1.7–7.7)
Neutrophils Relative %: 61 % (ref 43–77)
Platelets: 172 10*3/uL (ref 150–400)
RBC: 5.25 MIL/uL (ref 4.22–5.81)
RDW: 15.3 % (ref 11.5–15.5)
WBC: 7.6 10*3/uL (ref 4.0–10.5)

## 2014-09-23 LAB — COMPLETE METABOLIC PANEL WITH GFR
ALBUMIN: 4 g/dL (ref 3.6–5.1)
ALK PHOS: 47 U/L (ref 40–115)
ALT: 29 U/L (ref 9–46)
AST: 27 U/L (ref 10–35)
BILIRUBIN TOTAL: 0.4 mg/dL (ref 0.2–1.2)
BUN: 19 mg/dL (ref 7–25)
CO2: 25 mmol/L (ref 20–31)
Calcium: 9.2 mg/dL (ref 8.6–10.3)
Chloride: 105 mmol/L (ref 98–110)
Creat: 1.47 mg/dL — ABNORMAL HIGH (ref 0.70–1.33)
GFR, Est African American: 60 mL/min (ref >=60)
GFR, Est Non African American: 51 mL/min — ABNORMAL LOW (ref >=60)
GLUCOSE: 91 mg/dL (ref 65–99)
Potassium: 4.2 mmol/L (ref 3.5–5.3)
SODIUM: 141 mmol/L (ref 135–146)
TOTAL PROTEIN: 6.9 g/dL (ref 6.1–8.1)

## 2014-09-23 LAB — C-REACTIVE PROTEIN: CRP: <0.5 mg/dL (ref <0.60)

## 2014-09-23 NOTE — Progress Notes (Signed)
Subjective:    Patient ID: Connor Frazier, male    DOB: 1955-02-11, 59 y.o.   MRN: [id number]  HPI   59 year old with hardware associated deep infection left ankle who was initially seen by my partner Dr. Ninetta Lights in the hospital and also by myself in the hospital at [facility].  He had initial surgery on April the 26, 2016 with placement of antibiotic beads , and cultures that were obtained are unrevealingthen status post surgery on May 21, with removal of antibiotic beads and deep cultures obtained for aerobic anaerobic fungal and AFB cultures. None of these were revealing either. He was placed on IV daptomycin and ceftriaxone and discharged with plans to complete an 8 week course.  Unfortunately he was hospitalized at [facility] with flank pain and found to have on imaging by chest x-ray and CT scan evidence of what appeared to be likely septic emboli with nodular lesions with cavitation throughout the lungs. He had blood cultures obtained which were negative. He was seen by Dr. Silvestre Mesi with ID at [facility] and anti-biotic's were broadened to Zyvox and Zosyn. Patient had a transthoracic echocardiogram which failed to show any evidence of vegetations on his heart valves. He did not undergo a transesophageal echocardiogram. The central line was removed and apparently per the patient was thought to look infectious by the nurse who removed it. The catheter tip was cultured but no growth was obtained. The patient was then discharged to home on oral Zyvox and IV meropenem. Since then he developed thrombocytopenia with his platelets dipping to 114 when checked on June 27. Dr.Bhusal continue the patient's Zyvox and is remainder meropenem since then. I saw the patient in followup in July and added bactrim to give some MRSA Coag Neg staph coverage. He completed the merrem and remaind on bactrim but developed problems with renal function and then changed over to doxycyline.  Since then he had  surgery by Dr Lajoyce Corners on 08/28/2014 who performed Removal of deep hardware right ankle.  He found no evidence of purulence in the OR. No cultures sent.  He has remained on doxycycline. His ankle pain has improved substantially.  He saw his pulmonologist at [facility] who had CT lungs repeated and showed resolution of lung nodules.        Review of Systems  Constitutional: Negative for fever, chills, diaphoresis, activity change, appetite change, fatigue and unexpected weight change.  HENT: Negative for congestion, rhinorrhea, sinus pressure, sneezing, sore throat and trouble swallowing.   Eyes: Negative for photophobia and visual disturbance.  Respiratory: Negative for cough, chest tightness, shortness of breath, wheezing and stridor.   Cardiovascular: Negative for chest pain, palpitations and leg swelling.  Gastrointestinal: Negative for nausea, vomiting, abdominal pain, diarrhea, constipation, blood in stool, abdominal distention and anal bleeding.  Genitourinary: Negative for dysuria, hematuria, flank pain and difficulty urinating.  Musculoskeletal: Positive for arthralgias. Negative for myalgias, back pain, joint swelling and gait problem.  Skin: Positive for wound. Negative for color change, pallor and rash.  Neurological: Negative for dizziness, tremors, weakness and light-headedness.  Hematological: Negative for adenopathy. Does not bruise/bleed easily.  Psychiatric/Behavioral: Negative for behavioral problems, confusion, sleep disturbance, dysphoric mood, decreased concentration and agitation.       Objective:   Physical Exam  Constitutional: He is oriented to person, place, and time. He appears well-developed and well-nourished.  HENT:  Head: Normocephalic and atraumatic.  Eyes: Conjunctivae and EOM are normal.  Neck: Normal range of motion. Neck  supple.  Cardiovascular: Normal rate and regular rhythm.   Pulmonary/Chest: Effort normal. No respiratory distress. He has no  wheezes.  Abdominal: Soft. He exhibits no distension.  Musculoskeletal: Normal range of motion.  Neurological: He is alert and oriented to person, place, and time.  Skin: Skin is warm and dry. No rash noted. No erythema. No pallor.  Psychiatric: He has a normal mood and affect. His behavior is normal. Judgment and thought content normal.   09/23/14: ankle surgical sites:             Assessment & Plan:   #1 Osteomyelitis associated with hardware: will have him finish 2 months post removal of pins then stop them and followup with me in November  #2 Septic emboli to lungs: I reviewed his images with him and these have resolved. They either came from PICC line vs his heart valves.   #3 culture negative  Endocarditis vs Septic thrombus on PICC line: Patient never had TEE and he DOES have calcified valves seen on CT chest scan that I reviewed with him. We have given him proper treatment course for endocarditis (culture negative due to abx being on board)  He SHOULD be seen by Cardiologist for evaluation of the valvular issues discovered on CT scan  #4 Coronary atherosclerosis discovered on CT lungs: He should ALSO be seen by Cardiologist for NEWLY discovered but asymptomatic CAD. He should be on ASA statin  #5 NEWLY diagnosed hepatic steatosis reviewed on CT scan from [facility]. LFTS normal. Weight loss encouraged and to establish care with hepatologist   #6 CKD: creatinine stable  I spent greater than 40 minutes with the patient including greater than 50% of time in face to face counsel of the patient re his "culture negative endocarditis" hardware associated osteomyelitis of anle, CAD, septic emboli to lungs, CKD, hepatic steatosis  and in coordination of their care.

## 2014-09-24 ENCOUNTER — Ambulatory Visit: Payer: BLUE CROSS/BLUE SHIELD | Admitting: Rehabilitation

## 2014-09-24 DIAGNOSIS — R29898 Other symptoms and signs involving the musculoskeletal system: Secondary | ICD-10-CM

## 2014-09-24 DIAGNOSIS — M25561 Pain in right knee: Secondary | ICD-10-CM

## 2014-09-24 DIAGNOSIS — R262 Difficulty in walking, not elsewhere classified: Secondary | ICD-10-CM | POA: Diagnosis not present

## 2014-09-24 DIAGNOSIS — M25671 Stiffness of right ankle, not elsewhere classified: Secondary | ICD-10-CM

## 2014-09-24 DIAGNOSIS — R269 Unspecified abnormalities of gait and mobility: Secondary | ICD-10-CM

## 2014-09-24 LAB — SEDIMENTATION RATE: SED RATE: 4 mm/h (ref 0–20)

## 2014-09-24 NOTE — Therapy (Signed)
[facility] [address]  [address] [place], [place], [address] Phone: [phone]   Fax:  [phone]  Physical Therapy Treatment  Patient Details  Name: Connor Frazier MRN: [id number] Date of Birth: 1955/09/14 Referring Provider:  Nadara Mustard, MD  Encounter Date: 09/24/2014      PT End of Session - 09/24/14 1358    Visit Number 3   Number of Visits 12   Date for PT Re-Evaluation 10/30/14   PT Start Time 1357   PT Stop Time 1437   PT Time Calculation (min) 40 min      Past Medical History  Diagnosis Date  . PONV (postoperative nausea and vomiting)   . Elevated serum creatinine   . Hypercholesterolemia   . Chronic kidney disease     stage III- sees neph at [facility]  . GERD (gastroesophageal reflux disease)     takes probiotics  . H/O cytopenia     Medication induced  . Septic embolism   . Lung infection 2016  . 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 CAD in native artery 09/23/2014  . NAFLD (nonalcoholic fatty liver disease) 6/96/2952  . Valvular heart disease 09/23/2014    Past Surgical History  Procedure Laterality Date  . Appendectomy    . Colonoscopy    . Dental surgery      implants   . Orif ankle fracture Right 05/24/2014    Procedure: OPEN REDUCTION INTERNAL FIXATION (ORIF) ANKLE FRACTURE;  Surgeon: Nadara Mustard, MD;  [facility]: [facility] OR;  Service: Orthopedics;  Laterality: Right;  . Irrigation and debridement abscess Right 05/29/2014    rt ankle   . I&d extremity Right 05/29/2014    Procedure: IRRIGATION AND DEBRIDEMENT RIGHT ANKLE AND PLACE ANTIBIOTIC BEADS;  Surgeon: Nadara Mustard, MD;  Location: [facility] OR;  Service: Orthopedics;  Laterality: Right;  . Hardware removal Right 06/21/2014    Procedure: Removal Hardware Right Ankle, Place Antibiotic Beads, and Wound VAC;  Surgeon: Nadara Mustard, MD;  [facility]: [facility] OR;  Service: Orthopedics;  Laterality: Right;  . Peripherally inserted central  catheter insertion    . Picc line removal ([facility] hx)    . Peripherally inserted central catheter insertion    . Hardware removal Right 08/28/2014    Procedure: Removal Deep Hardware Right Ankle;  Surgeon: Nadara Mustard, MD;  Location: [facility] OR;  Service: Orthopedics;  Laterality: Right;    There were no vitals filed for this visit.  Visit Diagnosis:  Ankle weakness  Weakness of right hip  Difficulty walking  Abnormality of gait  Ankle stiffness, right  Right knee pain      Subjective Assessment - 09/24/14 1359    Subjective Reports no problems after last time but did notice hip soreness. Has gone down to 1 crutch now and feels like his ankle is getting stronger. Notes some Rt knee discomfort but no pain.    Currently in Pain? No/denies      TODAY'S TREATMENT TherEx -  Nustep level 5x5' (UE/LE) Weight shifting on Blue Foam side/side 10x with 2 pole assist DL Heel/ Toe Raise on Blue Foam 10x with 2 pole assist Slow March on Blue Foam 10x3" with 2 pole assist DF Rocker stretch 3x20" Alternating Hip Abduction on Blue Foam 10x each with 2 pole assist Bil Hamstring Stretch  SL Bridge 10x Bridges with Alt Knee Extension 10x Side Bridges with both knees bent 10x3" Prone Knee Flexion Stretch  Prone Bent  Knee Lift 15x each  TRX Squats 15x (occationally rocking forward and backward for ankle stretch with keeping heel down) PROM all directions for ankle and then toe extension stretching 3x20"       PT Short Term Goals - 09/19/14 1442    PT SHORT TERM GOAL #1   Title pt independent in intial HEP by 09/27/14   Status On-going           PT Long Term Goals - 09/19/14 1442    PT LONG TERM GOAL #1   Title pt able to return to regular exercise and participation in martial arts training by 10/30/14   Status On-going   PT LONG TERM GOAL #2   Title pt able to jog up to 5 minutes and hop without limitation by weakness or pain by 10/30/14   Status On-going   PT LONG TERM GOAL #3    Title pt displays R Hip and Ankle MMT 4+/5 or better grossly by 10/30/14   Status On-going               Plan - 09/24/14 1437    Clinical Impression Statement Improved weightshift noted today with squats and while walking without the crutch. Good tolerance to standing exercises without complaint of pain but pt did notice his ankle felt wobbly as it fatigued.    PT Next Visit Plan Continue with ankle ROM, HS stretching, hip and ankle stability, balance and gait, manual and modalities as needed.    Consulted and Agree with Plan of Care Patient        Problem List Patient Active Problem List   Diagnosis Date Noted  . 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 CAD in native artery 09/23/2014  . NAFLD (nonalcoholic fatty liver disease) 11/91/4782  . Valvular heart disease 09/23/2014  . Septic embolism 08/06/2014  . Chronic kidney disease (CKD), stage II (mild) 07/10/2014  . Bone infection 07/10/2014  . Lung nodule, multiple 07/10/2014  . CKD (chronic kidney disease)   . CKD (chronic kidney disease) stage 3, GFR 30-59 ml/min 06/20/2014  . Hardware complicating wound infection 06/19/2014  . Wound infection complicating hardware 05/29/2014  . Trimalleolar fracture of ankle, closed 05/24/2014    Ronney Lion, [place] 09/24/2014, 2:38 PM  [facility] [address]  [address] [place], [place], [address] Phone: [phone]   Fax:  [phone]

## 2014-09-27 ENCOUNTER — Ambulatory Visit: Payer: BLUE CROSS/BLUE SHIELD | Admitting: Rehabilitation

## 2014-09-27 DIAGNOSIS — R262 Difficulty in walking, not elsewhere classified: Secondary | ICD-10-CM | POA: Diagnosis not present

## 2014-09-27 DIAGNOSIS — M25671 Stiffness of right ankle, not elsewhere classified: Secondary | ICD-10-CM

## 2014-09-27 DIAGNOSIS — M25561 Pain in right knee: Secondary | ICD-10-CM

## 2014-09-27 DIAGNOSIS — R29898 Other symptoms and signs involving the musculoskeletal system: Secondary | ICD-10-CM

## 2014-09-27 DIAGNOSIS — R269 Unspecified abnormalities of gait and mobility: Secondary | ICD-10-CM

## 2014-09-27 NOTE — Therapy (Signed)
[facility] [address]  [address] [place], [place], [address] Phone: [phone]   Fax:  [phone]  Physical Therapy Treatment  Patient Details  Name: Connor Frazier MRN: [id number] Date of Birth: Aug 04, 1955 Referring Provider:  Nadara Mustard, MD  Encounter Date: 09/27/2014      PT End of Session - 09/27/14 1055    Visit Number 4   Number of Visits 12   Date for PT Re-Evaluation 10/30/14   PT Start Time 1054   PT Stop Time 1134   PT Time Calculation (min) 40 min      Past Medical History  Diagnosis Date  . PONV (postoperative nausea and vomiting)   . Elevated serum creatinine   . Hypercholesterolemia   . Chronic kidney disease     stage III- sees neph at [facility]  . GERD (gastroesophageal reflux disease)     takes probiotics  . H/O cytopenia     Medication induced  . Septic embolism   . Lung infection 2016  . 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 CAD in native artery 09/23/2014  . NAFLD (nonalcoholic fatty liver disease) 1/32/4401  . Valvular heart disease 09/23/2014    Past Surgical History  Procedure Laterality Date  . Appendectomy    . Colonoscopy    . Dental surgery      implants   . Orif ankle fracture Right 05/24/2014    Procedure: OPEN REDUCTION INTERNAL FIXATION (ORIF) ANKLE FRACTURE;  Surgeon: Nadara Mustard, MD;  [facility];  Service: Orthopedics;  Laterality: Right;  . Irrigation and debridement abscess Right 05/29/2014    rt ankle   . I&d extremity Right 05/29/2014    Procedure: IRRIGATION AND DEBRIDEMENT RIGHT ANKLE AND PLACE ANTIBIOTIC BEADS;  Surgeon: Nadara Mustard, MD;  [facility] OR;  Service: Orthopedics;  Laterality: Right;  . Hardware removal Right 06/21/2014    Procedure: Removal Hardware Right Ankle, Place Antibiotic Beads, and Wound VAC;  Surgeon: Nadara Mustard, MD;  [facility]: [facility] OR;  Service: Orthopedics;  Laterality: Right;  . Peripherally inserted central  catheter insertion    . Picc line removal ([facility] hx)    . Peripherally inserted central catheter insertion    . Hardware removal Right 08/28/2014    Procedure: Removal Deep Hardware Right Ankle;  Surgeon: Nadara Mustard, MD;  Location: [facility] OR;  Service: Orthopedics;  Laterality: Right;    There were no vitals filed for this visit.  Visit Diagnosis:  Ankle weakness  Weakness of right hip  Difficulty walking  Abnormality of gait  Ankle stiffness, right  Right knee pain      Subjective Assessment - 09/27/14 1055    Subjective Noted some back and ankle stiffness after last time but no increase in pain. Has been taking steps without crutches while at home.    Currently in Pain? No/denies       TODAY'S TREATMENT TherEx - Rec Bike level 1-2 x5' Seated BAPS level 4 15x for DF/PF, IV/EV, CW/CCW Slow March on Mini Tramp x60" HHA on rails Heel/Toe Raises on Mini Tramp 15x HHA on rails DF Rocker stretch 3x20" Standing Gastroc Stretch against wall 3x20" Standing Soleus Stretch against wall 2x20"  Gait Training with cones: Focus on increasing step length and stance time with 2-3" hold at each cone. 8 cones, 4 laps  TherEx - Standing Hip Abduction with Green TB at ankles 2x10, bilateral with 2 pole assist as needed Standing  Hip Flexion with Green TB at ankles 2x10, bilateral with 2 pole assist Seated Hamstring Stretch3x20"       PT Short Term Goals - 09/19/14 1442    PT SHORT TERM GOAL #1   Title pt independent in intial HEP by 09/27/14   Status On-going           PT Long Term Goals - 09/19/14 1442    PT LONG TERM GOAL #1   Title pt able to return to regular exercise and participation in martial arts training by 10/30/14   Status On-going   PT LONG TERM GOAL #2   Title pt able to jog up to 5 minutes and hop without limitation by weakness or pain by 10/30/14   Status On-going   PT LONG TERM GOAL #3   Title pt displays R Hip and Ankle MMT 4+/5 or better grossly by 10/30/14    Status On-going               Plan - 09/27/14 1133    Clinical Impression Statement Pt able to perform cone exercise well with only once instance of LOB which he was able to recover independently. Improved weightshit noted during mini tramp exercise with nearly equal weight on both side. Pt denied ankle pain but did note fatigue and stiffness toward the end of treatment.    PT Next Visit Plan Continue with ankle ROM, HS stretching, hip and ankle stability, balance and gait, manual and modalities as needed.    Consulted and Agree with Plan of Care Patient        Problem List Patient Active Problem List   Diagnosis Date Noted  . 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 CAD in native artery 09/23/2014  . NAFLD (nonalcoholic fatty liver disease) 16/11/9602  . Valvular heart disease 09/23/2014  . Septic embolism 08/06/2014  . Chronic kidney disease (CKD), stage II (mild) 07/10/2014  . Bone infection 07/10/2014  . Lung nodule, multiple 07/10/2014  . CKD (chronic kidney disease)   . CKD (chronic kidney disease) stage 3, GFR 30-59 ml/min 06/20/2014  . Hardware complicating wound infection 06/19/2014  . Wound infection complicating hardware 05/29/2014  . Trimalleolar fracture of ankle, closed 05/24/2014    Ronney Lion, [place] 09/27/2014, 11:39 AM  [facility] [address]  [address] [place], [place], [address] Phone: [phone]   Fax:  [phone]

## 2014-09-30 ENCOUNTER — Encounter: Payer: Self-pay | Admitting: Rehabilitation

## 2014-09-30 ENCOUNTER — Ambulatory Visit: Payer: BLUE CROSS/BLUE SHIELD | Admitting: Rehabilitation

## 2014-09-30 DIAGNOSIS — M25671 Stiffness of right ankle, not elsewhere classified: Secondary | ICD-10-CM

## 2014-09-30 DIAGNOSIS — R262 Difficulty in walking, not elsewhere classified: Secondary | ICD-10-CM

## 2014-09-30 DIAGNOSIS — R29898 Other symptoms and signs involving the musculoskeletal system: Secondary | ICD-10-CM

## 2014-09-30 NOTE — Therapy (Signed)
[facility] [address]  [address] [place], [place], [address] Phone: [phone]   Fax:  [phone]  Physical Therapy Treatment  Patient Details  Name: Connor Frazier MRN: [id number] Date of Birth: [birth year] Referring Provider:  Nadara Mustard, MD  Encounter Date: 09/30/2014      PT End of Session - 09/30/14 1622    Visit Number 5   Number of Visits 12   Date for PT Re-Evaluation 10/30/14   PT Start Time 0419   PT Stop Time 0458   PT Time Calculation (min) 39 min   Activity Tolerance Patient tolerated treatment well      Past Medical History  Diagnosis Date  . PONV (postoperative nausea and vomiting)   . Elevated serum creatinine   . Hypercholesterolemia   . Chronic kidney disease     stage III- sees neph at [facility] , [facility]  . GERD (gastroesophageal reflux disease)     takes probiotics  . H/O cytopenia     Medication induced  . Septic embolism   . Lung infection 2016  . 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 CAD in native artery 09/23/2014  . NAFLD (nonalcoholic fatty liver disease) 6/96/2952  . Valvular heart disease 09/23/2014    Past Surgical History  Procedure Laterality Date  . Appendectomy    . Colonoscopy    . Dental surgery      implants   . Orif ankle fracture Right 05/24/2014    Procedure: OPEN REDUCTION INTERNAL FIXATION (ORIF) ANKLE FRACTURE;  Surgeon: Nadara Mustard, MD;  [facility] OR;  Service: Orthopedics;  Laterality: Right;  . Irrigation and debridement abscess Right 05/29/2014    rt ankle   . I&d extremity Right 05/29/2014    Procedure: IRRIGATION AND DEBRIDEMENT RIGHT ANKLE AND PLACE ANTIBIOTIC BEADS;  Surgeon: Nadara Mustard, MD;  Location: [facility] OR;  Service: Orthopedics;  Laterality: Right;  . Hardware removal Right 06/21/2014    Procedure: Removal Hardware Right Ankle, Place Antibiotic Beads, and Wound VAC;  Surgeon: Nadara Mustard, MD;  Location: [facility] OR;  Service: Orthopedics;   Laterality: Right;  . Peripherally inserted central catheter insertion    . Picc line removal ([facility] hx)    . Peripherally inserted central catheter insertion    . Hardware removal Right 08/28/2014    Procedure: Removal Deep Hardware Right Ankle;  Surgeon: Nadara Mustard, MD;  Location: [facility] OR;  Service: Orthopedics;  Laterality: Right;    There were no vitals filed for this visit.  Visit Diagnosis:  Ankle weakness  Weakness of right hip  Difficulty walking  Ankle stiffness, right      Subjective Assessment - 09/30/14 1616    Subjective "it is definitely getting better"   Currently in Pain? No/denies      TODAY'S TREATMENT TherEx - Rec Bike level 2 x5' Seated BAPS level 4 15x for DF/PF, IV/EV, CW/CCW Slow March on Mini Tramp x10 each without hand hold Heel/Toe Raises on Mini Tramp 15x HHA on rails Standing Gastroc Stretch against wall 3x20" 4 way ankle blue band x 20 each  Gait Training 21ft x 4 with focus on heel to toe  TherEx - Standing Hip Abduction with Green and black TB at ankles 2x15,with chair assist Standing Hip Flexion with Green TB at ankles 2x15,  Seated Hamstring Stretch3x20" Standing with left foot on ball roll f/b x 10 x 3 without hold SBA  PT Short Term Goals - 09/19/14 1442    PT SHORT TERM GOAL #1   Title pt independent in intial HEP by 09/27/14   Status On-going           PT Long Term Goals - 09/19/14 1442    PT LONG TERM GOAL #1   Title pt able to return to regular exercise and participation in martial arts training by 10/30/14   Status On-going   PT LONG TERM GOAL #2   Title pt able to jog up to 5 minutes and hop without limitation by weakness or pain by 10/30/14   Status On-going   PT LONG TERM GOAL #3   Title pt displays R Hip and Ankle MMT 4+/5 or better grossly by 10/30/14   Status On-going               Plan - 09/30/14 1656    Clinical Impression Statement pt performed all  well including new balance TE without hand hold today;  still with fear of instability and falling.          Problem List Patient Active Problem List   Diagnosis Date Noted  . 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 CAD in native artery 09/23/2014  . NAFLD (nonalcoholic fatty liver disease) 16/11/9602  . Valvular heart disease 09/23/2014  . Septic embolism 08/06/2014  . Chronic kidney disease (CKD), stage II (mild) 07/10/2014  . Bone infection 07/10/2014  . Lung nodule, multiple 07/10/2014  . CKD (chronic kidney disease)   . CKD (chronic kidney disease) stage 3, GFR 30-59 ml/min 06/20/2014  . Hardware complicating wound infection 06/19/2014  . Wound infection complicating hardware 05/29/2014  . Trimalleolar fracture of ankle, closed 05/24/2014    Idamae Lusher, DPT, CMP 09/30/2014, 4:58 PM  [facility] [address]  [address] [place], [place], [address] Phone: [phone]   Fax:  [phone]

## 2014-10-03 ENCOUNTER — Ambulatory Visit: Payer: BLUE CROSS/BLUE SHIELD | Attending: Orthopedic Surgery | Admitting: Rehabilitation

## 2014-10-03 DIAGNOSIS — M25561 Pain in right knee: Secondary | ICD-10-CM | POA: Diagnosis present

## 2014-10-03 DIAGNOSIS — R269 Unspecified abnormalities of gait and mobility: Secondary | ICD-10-CM | POA: Diagnosis present

## 2014-10-03 DIAGNOSIS — M259 Joint disorder, unspecified: Secondary | ICD-10-CM | POA: Insufficient documentation

## 2014-10-03 DIAGNOSIS — R262 Difficulty in walking, not elsewhere classified: Secondary | ICD-10-CM | POA: Insufficient documentation

## 2014-10-03 DIAGNOSIS — R29898 Other symptoms and signs involving the musculoskeletal system: Secondary | ICD-10-CM | POA: Insufficient documentation

## 2014-10-03 DIAGNOSIS — M25671 Stiffness of right ankle, not elsewhere classified: Secondary | ICD-10-CM | POA: Diagnosis present

## 2014-10-03 NOTE — Therapy (Signed)
[facility] Out[facility] Rehabilitation [facility] [address]  [address] [place], [place], [address] Phone: [phone]   Fax:  [phone]  Physical Therapy Treatment  Patient Details  Name: Connor Frazier MRN: [id number] Date of Birth: 08-27-1955 Referring Provider:  Nadara Mustard, MD  Encounter Date: 10/03/2014      PT End of Session - 10/03/14 1450    Visit Number 6   Number of Visits 12   Date for PT Re-Evaluation 10/30/14   PT Start Time 1448   PT Stop Time 1527   PT Time Calculation (min) 39 min      Past Medical History  Diagnosis Date  . PONV (postoperative nausea and vomiting)   . Elevated serum creatinine   . Hypercholesterolemia   . Chronic kidney disease     stage III- sees neph at [facility]  . GERD (gastroesophageal reflux disease)     takes probiotics  . H/O cytopenia     Medication induced  . Septic embolism   . Lung infection 2016  . 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 CAD in native artery 09/23/2014  . NAFLD (nonalcoholic fatty liver disease) 5/36/6440  . Valvular heart disease 09/23/2014    Past Surgical History  Procedure Laterality Date  . Appendectomy    . Colonoscopy    . Dental surgery      implants   . Orif ankle fracture Right 05/24/2014    Procedure: OPEN REDUCTION INTERNAL FIXATION (ORIF) ANKLE FRACTURE;  Surgeon: Nadara Mustard, MD;  [facility];  Service: Orthopedics;  Laterality: Right;  . Irrigation and debridement abscess Right 05/29/2014    rt ankle   . I&d extremity Right 05/29/2014    Procedure: IRRIGATION AND DEBRIDEMENT RIGHT ANKLE AND PLACE ANTIBIOTIC BEADS;  Surgeon: Nadara Mustard, MD;  Location: [facility] OR;  Service: Orthopedics;  Laterality: Right;  . Hardware removal Right 06/21/2014    Procedure: Removal Hardware Right Ankle, Place Antibiotic Beads, and Wound VAC;  Surgeon: Nadara Mustard, MD;  Location: [facility] OR;  Service: Orthopedics;  Laterality: Right;  . Peripherally inserted central  catheter insertion    . Picc line removal ([facility] hx)    . Peripherally inserted central catheter insertion    . Hardware removal Right 08/28/2014    Procedure: Removal Deep Hardware Right Ankle;  Surgeon: Nadara Mustard, MD;  Location: [facility] OR;  Service: Orthopedics;  Laterality: Right;    There were no vitals filed for this visit.  Visit Diagnosis:  Ankle weakness  Weakness of right hip  Difficulty walking  Ankle stiffness, right  Abnormality of gait  Right knee pain      Subjective Assessment - 10/03/14 1450    Subjective Denies pain currently but states he has discomfort at times. Is trying to walk without crutches while at home but will use them for long distances and when his ankle gets tired.    Currently in Pain? No/denies            [facility] PT Assessment - 10/03/14 1451    ROM / Strength   AROM / PROM / Strength AROM;PROM   AROM   AROM Assessment Site Ankle   Right/Left Ankle Right   Right Ankle Dorsiflexion 10   Right Ankle Plantar Flexion 50   Right Ankle Inversion 24   Right Ankle Eversion 18   PROM   PROM Assessment Site Ankle   Right/Left Ankle Right   Right Ankle Dorsiflexion 12  Right Ankle Plantar Flexion 55   Right Ankle Inversion 24   Right Ankle Eversion 18      TODAY'S TREATMENT TherEx - Rec Bike level 2 x5' ROM check DF Rocker Stretch 3x20" TRX DL Squats with gentle rocking back and forth keeping heels down 15x3" holds TRX SL Squats with toe touch assist 10x each side (very difficult on Rt side) 6" Fwd Step ups 10x with 2 pole assist, 10x with 1 pole assist 8" Fwd Step ups 10x with 1 pole assist Standing with left foot on ball roll f/b, s/s x 20 with intermittent HHA Seated Hamstring Stretch 3x20"  Gait Training 154ft x with focus on heel to toe and increasing stride length.        PT Short Term Goals - 09/19/14 1442    PT SHORT TERM GOAL #1   Title pt independent in intial HEP by 09/27/14   Status On-going           PT Long  Term Goals - 09/19/14 1442    PT LONG TERM GOAL #1   Title pt able to return to regular exercise and participation in martial arts training by 10/30/14   Status On-going   PT LONG TERM GOAL #2   Title pt able to jog up to 5 minutes and hop without limitation by weakness or pain by 10/30/14   Status On-going   PT LONG TERM GOAL #3   Title pt displays R Hip and Ankle MMT 4+/5 or better grossly by 10/30/14   Status On-going               Plan - 10/03/14 1527    Clinical Impression Statement Pt needed more rest breaks due to harder standing exercises. Needed to stop and rest with parital SL squats due to difficult and discomfort near foot but pt able/wanting to finish. Good progress with steps today and pt able to complete 8" with minimal difficult and 1 pole assist.    PT Next Visit Plan MD note*Continue with ankle ROM, HS stretching, hip and ankle stability, balance and gait, manual and modalities as needed.    Consulted and Agree with Plan of Care Patient        Problem List Patient Active Problem List   Diagnosis Date Noted  . 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 CAD in native artery 09/23/2014  . NAFLD (nonalcoholic fatty liver disease) 16/11/9602  . Valvular heart disease 09/23/2014  . Septic embolism 08/06/2014  . Chronic kidney disease (CKD), stage II (mild) 07/10/2014  . Bone infection 07/10/2014  . Lung nodule, multiple 07/10/2014  . CKD (chronic kidney disease)   . CKD (chronic kidney disease) stage 3, GFR 30-59 ml/min 06/20/2014  . Hardware complicating wound infection 06/19/2014  . Wound infection complicating hardware 05/29/2014  . Trimalleolar fracture of ankle, closed 05/24/2014    Ronney Lion, PTA 10/03/2014, 4:02 PM  [facility] [address]  [address] [place], [place], [address] Phone: [phone]   Fax:  [phone]

## 2014-10-08 ENCOUNTER — Ambulatory Visit: Payer: BLUE CROSS/BLUE SHIELD | Admitting: Physical Therapy

## 2014-10-08 DIAGNOSIS — M25671 Stiffness of right ankle, not elsewhere classified: Secondary | ICD-10-CM

## 2014-10-08 DIAGNOSIS — R29898 Other symptoms and signs involving the musculoskeletal system: Secondary | ICD-10-CM

## 2014-10-08 DIAGNOSIS — R269 Unspecified abnormalities of gait and mobility: Secondary | ICD-10-CM

## 2014-10-08 DIAGNOSIS — R262 Difficulty in walking, not elsewhere classified: Secondary | ICD-10-CM

## 2014-10-08 DIAGNOSIS — M259 Joint disorder, unspecified: Secondary | ICD-10-CM | POA: Diagnosis not present

## 2014-10-08 DIAGNOSIS — M25561 Pain in right knee: Secondary | ICD-10-CM

## 2014-10-08 NOTE — Therapy (Signed)
[facility] Outpatient Rehabilitation [facility] [address]  [address] [place], [place], [address] Phone: [phone]   Fax:  [phone]  Physical Therapy Treatment  Patient Details  Name: Connor Frazier MRN: [id number] Date of Birth: [birth year] Referring Provider:  Nadara Mustard, MD  Encounter Date: 10/08/2014      PT End of Session - 10/08/14 1107    Visit Number 7   Number of Visits 12   Date for PT Re-Evaluation 10/30/14   PT Start Time 1056   PT Stop Time 1146   PT Time Calculation (min) 50 min      Past Medical History  Diagnosis Date  . PONV (postoperative nausea and vomiting)   . Elevated serum creatinine   . Hypercholesterolemia   . Chronic kidney disease     stage III- sees neph at [facility]  . GERD (gastroesophageal reflux disease)     takes probiotics  . H/O cytopenia     Medication induced  . Septic embolism   . Lung infection 2016  . 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 CAD in native artery 09/23/2014  . NAFLD (nonalcoholic fatty liver disease) 05/03/270  . Valvular heart disease 09/23/2014    Past Surgical History  Procedure Laterality Date  . Appendectomy    . Colonoscopy    . Dental surgery      implants   . Orif ankle fracture Right 05/24/2014    Procedure: OPEN REDUCTION INTERNAL FIXATION (ORIF) ANKLE FRACTURE;  Surgeon: Nadara Mustard, MD;  Location: [facility] OR;  Service: Orthopedics;  Laterality: Right;  . Irrigation and debridement abscess Right 05/29/2014    rt ankle   . I&d extremity Right 05/29/2014    Procedure: IRRIGATION AND DEBRIDEMENT RIGHT ANKLE AND PLACE ANTIBIOTIC BEADS;  Surgeon: Nadara Mustard, MD;  Location: [facility] OR;  Service: Orthopedics;  Laterality: Right;  . Hardware removal Right 06/21/2014    Procedure: Removal Hardware Right Ankle, Place Antibiotic Beads, and Wound VAC;  Surgeon: Nadara Mustard, MD;  Location: [facility] OR;  Service: Orthopedics;  Laterality: Right;  . Peripherally inserted central  catheter insertion    . Picc line removal ([facility] hx)    . Peripherally inserted central catheter insertion    . Hardware removal Right 08/28/2014    Procedure: Removal Deep Hardware Right Ankle;  Surgeon: Nadara Mustard, MD;  Location: [facility] OR;  Service: Orthopedics;  Laterality: Right;    There were no vitals filed for this visit.  Visit Diagnosis:  Ankle weakness  Weakness of right hip  Difficulty walking  Ankle stiffness, right  Abnormality of gait  Right knee pain      Subjective Assessment - 10/08/14 1100    Subjective States notes some swelling to R ankle with activities involving standing/walking.  This decreases with ice and elevation.  States R LE seems to be getting stronger and R knee pain is improving but is still present.  States does not typically use walked AD at home and is using single axillary crutch while away from home.  States was on his feet up to 2 hours shopping over the weekend noting a "twinge" of pain with the swelling.   Currently in Pain? No/denies   Pain Score --  states is typically pain-free but notes "twinge" with prolonged walking            [facility] PT Assessment - 10/08/14 0001    Strength   Right Hip Flexion 4+/5   Right  Hip Extension 4+/5   Right Hip ABduction 4+/5   Right Ankle Dorsiflexion 5/5   Right Ankle Plantar Flexion 3-/5   Right Ankle Inversion 4+/5   Right Ankle Eversion 4/5      TODAY'S TREATMENT TherEx - Rec Bike INT 2/5, 1'/1' x6' R LE MMT assessment Free Squat 10x (equal R and L wt shift but tends to bend L knee more than R - which also produces more L ankle DF vs R) Ankle DF Rocker Stretch 3x20" Wall Slide to 90/90 10x5" (equal R and L with good mechanics) 6" step BW stepdown to R forefoot followed by strong toe-off on R (tends to keep most of wt over L LE) - no Pole A B Heel Raise at UBE 20x TM FW and B side stepping at 10% incline and 0.9 mph 60" each Fitter Lateral Lunge 2 Blue 15x each (no Pole A); BW Lunge with R  forefoot push 2 Blue 15x (B Pole A) TRX DL Squat with Heel Raise at top 20x                        PT Education - 10/08/14 1150    Education provided Yes   Education Details HEP update   Person(s) Educated Patient   Methods Explanation;Demonstration;Handout   Comprehension Returned demonstration;Verbalized understanding          PT Short Term Goals - 10/08/14 1150    PT SHORT TERM GOAL #1   Title pt independent in intial HEP by 09/27/14   Status Achieved           PT Long Term Goals - 10/08/14 1150    PT LONG TERM GOAL #1   Title pt able to return to regular exercise and participation in martial arts training by 10/30/14   Status On-going   PT LONG TERM GOAL #2   Title pt able to jog up to 5 minutes and hop without limitation by weakness or pain by 10/30/14   Status On-going   PT LONG TERM GOAL #3   Title pt displays R Hip and Ankle MMT 4+/5 or better grossly by 10/30/14   Status On-going               Plan - 10/08/14 1151    Clinical Impression Statement Connor Frazier has attended 7 of 12 visits in his PT POC.  He has progressed well with R ankle ROM and is [facility] all planes.  His R hip strength has progressed to 4+/5 or better all planes.  R Ankle still very weak into PF but DF has improved to 5/5, IV to 4+/5.  EV still 4/5.  Squat mechanics have improved to equal wt bearing R vs L but tends to flex L knee and ankle > R.  Pt ambulating household distances without need for AD but uses single axillary crutch with community ambulation.  He notes increased edema to R foot/ankle with prolonged standing/walking but is able to decrease with ice and elevation.  Overall, pt seems to be progressing very well and remains highly motivated.  He has 2 weeks of PT after this week remaining on his current POC.  He may be ready for discharge to HEP at that time - we will assess in 2 weeks and make recommendations.   PT Next Visit Plan continue hip and ankle strength and  stability training; balance and gait training; manual and modalities as needed.    Consulted and Agree with Plan  of Care Patient        Problem List Patient Active Problem List   Diagnosis Date Noted  . 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 CAD in native artery 09/23/2014  . NAFLD (nonalcoholic fatty liver disease) 16/11/9602  . Valvular heart disease 09/23/2014  . Septic embolism 08/06/2014  . Chronic kidney disease (CKD), stage II (mild) 07/10/2014  . Bone infection 07/10/2014  . Lung nodule, multiple 07/10/2014  . CKD (chronic kidney disease)   . CKD (chronic kidney disease) stage 3, GFR 30-59 ml/min 06/20/2014  . Hardware complicating wound infection 06/19/2014  . Wound infection complicating hardware 05/29/2014  . Trimalleolar fracture of ankle, closed 05/24/2014    Japheth Diekman PT, OCS 10/08/2014, 11:58 AM  [facility] [address]  [address] [place], [place], [address] Phone: [phone]   Fax:  [phone]

## 2014-10-10 ENCOUNTER — Ambulatory Visit: Payer: BLUE CROSS/BLUE SHIELD | Admitting: Physical Therapy

## 2014-10-10 DIAGNOSIS — M259 Joint disorder, unspecified: Secondary | ICD-10-CM | POA: Diagnosis not present

## 2014-10-10 DIAGNOSIS — M25671 Stiffness of right ankle, not elsewhere classified: Secondary | ICD-10-CM

## 2014-10-10 DIAGNOSIS — M25561 Pain in right knee: Secondary | ICD-10-CM

## 2014-10-10 DIAGNOSIS — R29898 Other symptoms and signs involving the musculoskeletal system: Secondary | ICD-10-CM

## 2014-10-10 DIAGNOSIS — R262 Difficulty in walking, not elsewhere classified: Secondary | ICD-10-CM

## 2014-10-10 DIAGNOSIS — R269 Unspecified abnormalities of gait and mobility: Secondary | ICD-10-CM

## 2014-10-10 NOTE — Therapy (Signed)
[facility] Outpatient Rehabilitation [facility] [address]  [address] [place], [place], [address] Phone: [phone]   Fax:  [phone]  Physical Therapy Treatment  Patient Details  Name: Connor Frazier MRN: [id number] Date of Birth: 03/27/1955 Referring Provider:  Nadara Mustard, MD  Encounter Date: 10/10/2014      PT End of Session - 10/10/14 1407    Visit Number 8   Number of Visits 12   Date for PT Re-Evaluation 10/30/14   PT Start Time 1405   PT Stop Time 1450   PT Time Calculation (min) 45 min      Past Medical History  Diagnosis Date  . PONV (postoperative nausea and vomiting)   . Elevated serum creatinine   . Hypercholesterolemia   . Chronic kidney disease     stage III- sees neph at [facility]  . GERD (gastroesophageal reflux disease)     takes probiotics  . H/O cytopenia     Medication induced  . Septic embolism   . Lung infection 2016  . 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 CAD in native artery 09/23/2014  . NAFLD (nonalcoholic fatty liver disease) 6/96/2952  . Valvular heart disease 09/23/2014    Past Surgical History  Procedure Laterality Date  . Appendectomy    . Colonoscopy    . Dental surgery      implants   . Orif ankle fracture Right 05/24/2014    Procedure: OPEN REDUCTION INTERNAL FIXATION (ORIF) ANKLE FRACTURE;  Surgeon: Nadara Mustard, MD;  [facility];  Service: Orthopedics;  Laterality: Right;  . Irrigation and debridement abscess Right 05/29/2014    rt ankle   . I&d extremity Right 05/29/2014    Procedure: IRRIGATION AND DEBRIDEMENT RIGHT ANKLE AND PLACE ANTIBIOTIC BEADS;  Surgeon: Nadara Mustard, MD;  [facility]: [facility] OR;  Service: Orthopedics;  Laterality: Right;  . Hardware removal Right 06/21/2014    Procedure: Removal Hardware Right Ankle, Place Antibiotic Beads, and Wound VAC;  Surgeon: Nadara Mustard, MD;  [facility] OR;  Service: Orthopedics;  Laterality: Right;  . Peripherally inserted central  catheter insertion    . Picc line removal ([facility] hx)    . Peripherally inserted central catheter insertion    . Hardware removal Right 08/28/2014    Procedure: Removal Deep Hardware Right Ankle;  Surgeon: Nadara Mustard, MD;  Location: [facility] OR;  Service: Orthopedics;  Laterality: Right;    There were no vitals filed for this visit.  Visit Diagnosis:  Ankle weakness  Weakness of right hip  Difficulty walking  Ankle stiffness, right  Abnormality of gait  Right knee pain      Subjective Assessment - 10/10/14 1408    Subjective States noted soreness in B thighs after last treatment but no increase in foot/ankle pain.  States does note some pain in R heel upon waking in AM and putting foot on ground.  Pt saw MD yesterday and he is pleased with progress and released pt to return to work  on 10/21/14.  Next MD appointment in Oct.   Currently in Pain? No/denies            TODAY'S TREATMENT TherEx - Rec Bike INT 2/5, 1'/1' x6' Ankle DF Rocker Stretch 3x20" TRX DL Squat with Heel Raise at top 20x BOSU Narrow DL Squat with TRX assist 84X BOSU R SL Mini Squat (L toes on BOSU) with TRX Assist 10x BAPS Standing, LVL 3, F/B with B Pole A  2x10 (Close SBA with pt experiencing one LOB with each set requiring A from PT ) BOSU R SLS Balance 2x30" with B Pole A (Close SBA) SLS rotation stab Red TB 5x5" on L then 5x5" on R with light L toe assist Standing hip ABD and Ext Green TB 12x each with single pole A (performed B LE)                      PT Short Term Goals - 10/08/14 1150    PT SHORT TERM GOAL #1   Title pt independent in intial HEP by 09/27/14   Status Achieved           PT Long Term Goals - 10/08/14 1150    PT LONG TERM GOAL #1   Title pt able to return to regular exercise and participation in martial arts training by 10/30/14   Status On-going   PT LONG TERM GOAL #2   Title pt able to jog up to 5 minutes and hop without limitation by weakness or pain by  10/30/14   Status On-going   PT LONG TERM GOAL #3   Title pt displays R Hip and Ankle MMT 4+/5 or better grossly by 10/30/14   Status On-going               Plan - 10/10/14 1431    Clinical Impression Statement Significantly decreased balance/proprioception/control with SLS activities on R vs L and requires close SBA with these activities at this time. Very motivated and states that he felt as though balance was improving throughout today's treatment.  Pt's R LE fatigues rather quickly in clinic as well as at home (states climbed attic stairs and had trouble coming back down due to R LE fatigue).   PT Next Visit Plan continue hip and ankle strength and stability training; balance and gait training; manual and modalities as needed.    Consulted and Agree with Plan of Care Patient        Problem List Patient Active Problem List   Diagnosis Date Noted  . 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 CAD in native artery 09/23/2014  . NAFLD (nonalcoholic fatty liver disease) 16/11/9602  . Valvular heart disease 09/23/2014  . Septic embolism 08/06/2014  . Chronic kidney disease (CKD), stage II (mild) 07/10/2014  . Bone infection 07/10/2014  . Lung nodule, multiple 07/10/2014  . CKD (chronic kidney disease)   . CKD (chronic kidney disease) stage 3, GFR 30-59 ml/min 06/20/2014  . Hardware complicating wound infection 06/19/2014  . Wound infection complicating hardware 05/29/2014  . Trimalleolar fracture of ankle, closed 05/24/2014    Britnay Magnussen PT, OCS 10/10/2014, 3:07 PM  [facility] [address]  [address] [place], [place], [address] Phone: [phone]   Fax:  [phone]

## 2014-10-15 ENCOUNTER — Ambulatory Visit: Payer: BLUE CROSS/BLUE SHIELD | Admitting: Physical Therapy

## 2014-10-15 DIAGNOSIS — R262 Difficulty in walking, not elsewhere classified: Secondary | ICD-10-CM

## 2014-10-15 DIAGNOSIS — M25671 Stiffness of right ankle, not elsewhere classified: Secondary | ICD-10-CM

## 2014-10-15 DIAGNOSIS — R269 Unspecified abnormalities of gait and mobility: Secondary | ICD-10-CM

## 2014-10-15 DIAGNOSIS — M259 Joint disorder, unspecified: Secondary | ICD-10-CM | POA: Diagnosis not present

## 2014-10-15 DIAGNOSIS — R29898 Other symptoms and signs involving the musculoskeletal system: Secondary | ICD-10-CM

## 2014-10-15 NOTE — Therapy (Signed)
[facility] Outpatient Rehabilitation [facility] [address]  [address] [place], [place], [address] Phone: [phone]   Fax:  [phone]  Physical Therapy Treatment  Patient Details  Name: Connor Frazier MRN: [id number] Date of Birth: 1955-12-13 Referring Provider:  Nadara Mustard, MD  Encounter Date: 10/15/2014      PT End of Session - 10/15/14 1613    Visit Number 9   Number of Visits 12   Date for PT Re-Evaluation 10/30/14   PT Start Time 1532   PT Stop Time 1614   PT Time Calculation (min) 42 min      Past Medical History  Diagnosis Date  . PONV (postoperative nausea and vomiting)   . Elevated serum creatinine   . Hypercholesterolemia   . Chronic kidney disease     stage III- sees neph at [facility]  . GERD (gastroesophageal reflux disease)     takes probiotics  . H/O cytopenia     Medication induced  . Septic embolism   . Lung infection 2016  . 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 CAD in native artery 09/23/2014  . NAFLD (nonalcoholic fatty liver disease) 1/32/4401  . Valvular heart disease 09/23/2014    Past Surgical History  Procedure Laterality Date  . Appendectomy    . Colonoscopy    . Dental surgery      implants   . Orif ankle fracture Right 05/24/2014    Procedure: OPEN REDUCTION INTERNAL FIXATION (ORIF) ANKLE FRACTURE;  Surgeon: Nadara Mustard, MD;  [facility] OR;  Service: Orthopedics;  Laterality: Right;  . Irrigation and debridement abscess Right 05/29/2014    rt ankle   . I&d extremity Right 05/29/2014    Procedure: IRRIGATION AND DEBRIDEMENT RIGHT ANKLE AND PLACE ANTIBIOTIC BEADS;  Surgeon: Nadara Mustard, MD;  Location: [facility] OR;  Service: Orthopedics;  Laterality: Right;  . Hardware removal Right 06/21/2014    Procedure: Removal Hardware Right Ankle, Place Antibiotic Beads, and Wound VAC;  Surgeon: Nadara Mustard, MD;  Location: [facility] OR;  Service: Orthopedics;  Laterality: Right;  . Peripherally inserted central  catheter insertion    . Picc line removal ([facility] hx)    . Peripherally inserted central catheter insertion    . Hardware removal Right 08/28/2014    Procedure: Removal Deep Hardware Right Ankle;  Surgeon: Nadara Mustard, MD;  Location: [facility] OR;  Service: Orthopedics;  Laterality: Right;    There were no vitals filed for this visit.  Visit Diagnosis:  Ankle weakness  Weakness of right hip  Difficulty walking  Ankle stiffness, right  Abnormality of gait      Subjective Assessment - 10/15/14 1750    Subjective has been performing HEP.  States R LE still fatigues quicker vs L.   Currently in Pain? No/denies          TODAY'S TREATMENT TherEx - Elliptical lvl 2.5 3' Ankle DF Rocker Stretch 3x20" TRX DL Squat with Heel Raise at top 15x B Heel Raise at UBE 20x TRX Squat with Hop 2x10 BAPS Standing LVL 4, F/B with B Pole A 10x (much better than last week) SL golfers lift/deadlift 6# 10x Minitramp B hop 20" Minitramp B Heel/Toe 20x DL Narrow Standing on BOSU (down) rebounder 2kg chest toss/catch 20x Bridge on Heels 10x5" Side Bridge into star (elbow and knee) SL Bridge 10x each  PT Education - 10/15/14 1753    Education provided Yes   Education Details advised pt to try walking around on toes some for HEP progression   Person(s) Educated Patient   Methods Explanation   Comprehension Verbalized understanding          PT Short Term Goals - 10/08/14 1150    PT SHORT TERM GOAL #1   Title pt independent in intial HEP by 09/27/14   Status Achieved           PT Long Term Goals - 10/08/14 1150    PT LONG TERM GOAL #1   Title pt able to return to regular exercise and participation in martial arts training by 10/30/14   Status On-going   PT LONG TERM GOAL #2   Title pt able to jog up to 5 minutes and hop without limitation by weakness or pain by 10/30/14   Status On-going   PT LONG TERM GOAL #3   Title pt displays R Hip and  Ankle MMT 4+/5 or better grossly by 10/30/14   Status On-going               Plan - 10/15/14 1618    Clinical Impression Statement Very good progress overall but still limited level of function due to rapid fatigue and R LE weakness.  Gait mildly antalgic in general but worsens with fatigue. Attempted squats with small hops and TRX A.  No pain but R ankle weakness prevents stable forefoot landing.   PT Next Visit Plan Continue hip and ankle strength and stability training; balance and gait training; manual and modalities as needed.   Consulted and Agree with Plan of Care Patient        Problem List Patient Active Problem List   Diagnosis Date Noted  . 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 CAD in native artery 09/23/2014  . NAFLD (nonalcoholic fatty liver disease) 16/11/9602  . Valvular heart disease 09/23/2014  . Septic embolism 08/06/2014  . Chronic kidney disease (CKD), stage II (mild) 07/10/2014  . Bone infection 07/10/2014  . Lung nodule, multiple 07/10/2014  . CKD (chronic kidney disease)   . CKD (chronic kidney disease) stage 3, GFR 30-59 ml/min 06/20/2014  . Hardware complicating wound infection 06/19/2014  . Wound infection complicating hardware 05/29/2014  . Trimalleolar fracture of ankle, closed 05/24/2014    Benjimin Hadden PT, OCS 10/15/2014, 5:54 PM  [facility] [address]  [address] [place], [place], [address] Phone: [phone]   Fax:  [phone]

## 2014-10-17 ENCOUNTER — Ambulatory Visit: Payer: BLUE CROSS/BLUE SHIELD | Admitting: Physical Therapy

## 2014-10-17 DIAGNOSIS — M25671 Stiffness of right ankle, not elsewhere classified: Secondary | ICD-10-CM

## 2014-10-17 DIAGNOSIS — M259 Joint disorder, unspecified: Secondary | ICD-10-CM | POA: Diagnosis not present

## 2014-10-17 DIAGNOSIS — R29898 Other symptoms and signs involving the musculoskeletal system: Secondary | ICD-10-CM

## 2014-10-17 NOTE — Therapy (Signed)
[facility] Outpatient Rehabilitation [facility] [address]  [address] [place], [place], [address] Phone: [phone]   Fax:  [phone]  Physical Therapy Treatment  Patient Details  Name: Connor Frazier MRN: [id number] Date of Birth: [birth year] Referring Provider:  Nadara Mustard, MD  Encounter Date: 10/17/2014      PT End of Session - 10/17/14 1105    Visit Number 10   Number of Visits 12   Date for PT Re-Evaluation 10/30/14   PT Start Time 1105   PT Stop Time 1149   PT Time Calculation (min) 44 min   Activity Tolerance Patient tolerated treatment well   Behavior During Therapy [facility] for tasks assessed/performed      Past Medical History  Diagnosis Date  . PONV (postoperative nausea and vomiting)   . Elevated serum creatinine   . Hypercholesterolemia   . Chronic kidney disease     stage III- sees neph at [facility]  . GERD (gastroesophageal reflux disease)     takes probiotics  . H/O cytopenia     Medication induced  . Septic embolism   . Lung infection 2016  . 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 CAD in native artery 09/23/2014  . NAFLD (nonalcoholic fatty liver disease) 6/96/2952  . Valvular heart disease 09/23/2014    Past Surgical History  Procedure Laterality Date  . Appendectomy    . Colonoscopy    . Dental surgery      implants   . Orif ankle fracture Right 05/24/2014    Procedure: OPEN REDUCTION INTERNAL FIXATION (ORIF) ANKLE FRACTURE;  Surgeon: Nadara Mustard, MD;  [facility] OR;  Service: Orthopedics;  Laterality: Right;  . Irrigation and debridement abscess Right 05/29/2014    rt ankle   . I&d extremity Right 05/29/2014    Procedure: IRRIGATION AND DEBRIDEMENT RIGHT ANKLE AND PLACE ANTIBIOTIC BEADS;  Surgeon: Nadara Mustard, MD;  [facility]: [facility] OR;  Service: Orthopedics;  Laterality: Right;  . Hardware removal Right 06/21/2014    Procedure: Removal Hardware Right Ankle, Place Antibiotic Beads, and Wound VAC;  Surgeon:  Nadara Mustard, MD;  [facility] OR;  Service: Orthopedics;  Laterality: Right;  . Peripherally inserted central catheter insertion    . Picc line removal ([facility] hx)    . Peripherally inserted central catheter insertion    . Hardware removal Right 08/28/2014    Procedure: Removal Deep Hardware Right Ankle;  Surgeon: Nadara Mustard, MD;  Location: [facility] OR;  Service: Orthopedics;  Laterality: Right;    There were no vitals filed for this visit.  Visit Diagnosis:  Ankle weakness  Weakness of right hip  Ankle stiffness, right      Subjective Assessment - 10/17/14 1108    Subjective No new complaints. Just stiffness.   Currently in Pain? No/denies       Treatment:   TherEx - Elliptical lvl 2.5 4' Ankle DF Rocker Stretch 3x20" TRX DL Squat with Heel Raise at top 15x B Heel Raise at 10x, 2 sets of 5 with 5 sec hold, toes out and in 2x5 with 5 second hold TRX Squat with Hop 2x10 BAPS Standing LVL 4, F/B with B Pole A 2 x10x  SL golfers lift/deadlift 6# 10x with one UE support; 1x5 without suport; 1x5 with 1 UE support Bridge on ball 10 sec hold x 10 Side Bridge into star (elbow and knee) SL Bridge 2 x 10x each  PT Short Term Goals - 10/08/14 1150    PT SHORT TERM GOAL #1   Title pt independent in intial HEP by 09/27/14   Status Achieved           PT Long Term Goals - 10/17/14 1110    PT LONG TERM GOAL #1   Title pt able to return to regular exercise and participation in martial arts training by 10/30/14   Status On-going   PT LONG TERM GOAL #2   Title pt able to jog up to 5 minutes and hop without limitation by weakness or pain by 10/30/14   Status On-going   PT LONG TERM GOAL #3   Title pt displays R Hip and Ankle MMT 4+/5 or better grossly by 10/30/14   Status On-going               Plan - 10/17/14 1201    Clinical Impression Statement Able to increase reps in some of the exercises today. Still quick to fatigue.  Increasing strength in R PF, but still can't reach full range or perform single leg PF without UE support.   PT Next Visit Plan Continue hip and ankle strength and stability training; balance and gait training; manual and modalities as needed.        Problem List Patient Active Problem List   Diagnosis Date Noted  . 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 CAD in native artery 09/23/2014  . NAFLD (nonalcoholic fatty liver disease) 16/11/9602  . Valvular heart disease 09/23/2014  . Septic embolism 08/06/2014  . Chronic kidney disease (CKD), stage II (mild) 07/10/2014  . Bone infection 07/10/2014  . Lung nodule, multiple 07/10/2014  . CKD (chronic kidney disease)   . CKD (chronic kidney disease) stage 3, GFR 30-59 ml/min 06/20/2014  . Hardware complicating wound infection 06/19/2014  . Wound infection complicating hardware 05/29/2014  . Trimalleolar fracture of ankle, closed 05/24/2014   Solon Palm PT  10/17/2014, 12:06 PM  [facility] [address]  [address] [place], [place], [address] Phone: [phone]   Fax:  [phone]

## 2014-10-21 DIAGNOSIS — E789 Disorder of lipoprotein metabolism, unspecified: Secondary | ICD-10-CM | POA: Insufficient documentation

## 2014-10-22 ENCOUNTER — Ambulatory Visit: Payer: BLUE CROSS/BLUE SHIELD | Admitting: Physical Therapy

## 2014-10-22 DIAGNOSIS — M259 Joint disorder, unspecified: Secondary | ICD-10-CM | POA: Diagnosis not present

## 2014-10-22 DIAGNOSIS — R262 Difficulty in walking, not elsewhere classified: Secondary | ICD-10-CM

## 2014-10-22 DIAGNOSIS — R29898 Other symptoms and signs involving the musculoskeletal system: Secondary | ICD-10-CM

## 2014-10-22 DIAGNOSIS — R269 Unspecified abnormalities of gait and mobility: Secondary | ICD-10-CM

## 2014-10-22 NOTE — Therapy (Signed)
[facility] [address]  [address] [place], [place], [address] Phone: [phone]   Fax:  [phone]  Physical Therapy Treatment  Patient Details  Name: Connor Frazier MRN: [id number] Date of Birth: 15-Jun-1955 Referring Provider:  Nadara Mustard, MD  Encounter Date: 10/22/2014      PT End of Session - 10/22/14 1707    Visit Number 11   Number of Visits 12   Date for PT Re-Evaluation 10/30/14   PT Start Time 1701   PT Stop Time 1744   PT Time Calculation (min) 43 min      Past Medical History  Diagnosis Date  . PONV (postoperative nausea and vomiting)   . Elevated serum creatinine   . Hypercholesterolemia   . Chronic kidney disease     stage III- sees neph at [facility] , [facility]  . GERD (gastroesophageal reflux disease)     takes probiotics  . H/O cytopenia     Medication induced  . Septic embolism   . Lung infection 2016  . 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 CAD in native artery 09/23/2014  . NAFLD (nonalcoholic fatty liver disease) 05/03/270  . Valvular heart disease 09/23/2014    Past Surgical History  Procedure Laterality Date  . Appendectomy    . Colonoscopy    . Dental surgery      implants   . Orif ankle fracture Right 05/24/2014    Procedure: OPEN REDUCTION INTERNAL FIXATION (ORIF) ANKLE FRACTURE;  Surgeon: Nadara Mustard, MD;  [facility]: [facility] OR;  Service: Orthopedics;  Laterality: Right;  . Irrigation and debridement abscess Right 05/29/2014    rt ankle   . I&d extremity Right 05/29/2014    Procedure: IRRIGATION AND DEBRIDEMENT RIGHT ANKLE AND PLACE ANTIBIOTIC BEADS;  Surgeon: Nadara Mustard, MD;  [facility] OR;  Service: Orthopedics;  Laterality: Right;  . Hardware removal Right 06/21/2014    Procedure: Removal Hardware Right Ankle, Place Antibiotic Beads, and Wound VAC;  Surgeon: Nadara Mustard, MD;  [facility] OR;  Service: Orthopedics;  Laterality: Right;  . Peripherally inserted central  catheter insertion    . Picc line removal ([facility] hx)    . Peripherally inserted central catheter insertion    . Hardware removal Right 08/28/2014    Procedure: Removal Deep Hardware Right Ankle;  Surgeon: Nadara Mustard, MD;  Location: [facility] OR;  Service: Orthopedics;  Laterality: Right;    There were no vitals filed for this visit.  Visit Diagnosis:  Ankle weakness  Weakness of right hip  Difficulty walking  Abnormality of gait      Subjective Assessment - 10/22/14 1704    Subjective states feels stiff and sore all over today.  States has noted some lower back pain since returning to work (states due to standing on concrete while at work).  He states ankle seems much more swollen lately.   Currently in Pain? Yes   Pain Score 1   stiff, swollen   Pain Location Ankle   Pain Orientation Right   Multiple Pain Sites Yes   Pain Score 2   Pain Location Back   Pain Orientation Lower;Right   Aggravating Factors  prolonged standing/walking (especially on concrete)           TODAY'S TREATMENT TherEx - NuStep lvl 5, 4' Ankle DF Rocker Stretch 2x20" TRX DL Squat with Heel Raise at top 20x Bridge on Heels 10x5" R SL Bridge with foot on Blue Disc 10x2"  Prone plank on toes with ankles slightly PF 3x10" Side Bridge into star (elbow and knee) 10x each Foam Beam F/B tandem gait 3 laps then 4 laps on toes FW only 9" BW stepdown onto R forefoot 12x (still very difficult and tends to FW shift upper body over L foot despite VC and TC) 9" stepup with R forefoot on step (heel hanging off back edge) 10x with single pole A                        PT Short Term Goals - 10/08/14 1150    PT SHORT TERM GOAL #1   Title pt independent in intial HEP by 09/27/14   Status Achieved           PT Long Term Goals - 10/17/14 1110    PT LONG TERM GOAL #1   Title pt able to return to regular exercise and participation in martial arts training by 10/30/14   Status On-going   PT LONG  TERM GOAL #2   Title pt able to jog up to 5 minutes and hop without limitation by weakness or pain by 10/30/14   Status On-going   PT LONG TERM GOAL #3   Title pt displays R Hip and Ankle MMT 4+/5 or better grossly by 10/30/14   Status On-going               Plan - 10/22/14 1748    Clinical Impression Statement pt stiff and sore in ankle and lower back today so decreased intensity of standing exercises.  Pt still with weakness in R ankle PF limiting ability to toe walk and to perform BW stepdown to R forefoot.  Was able to perform step-up with R forefoot (heel off back edge of step) however.   PT Next Visit Plan Continue hip and ankle strength and stability training; balance and gait training; manual and modalities as needed.   Consulted and Agree with Plan of Care Patient        Problem List Patient Active Problem List   Diagnosis Date Noted  . 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 CAD in native artery 09/23/2014  . NAFLD (nonalcoholic fatty liver disease) 82/95/6213  . Valvular heart disease 09/23/2014  . Septic embolism 08/06/2014  . Chronic kidney disease (CKD), stage II (mild) 07/10/2014  . Bone infection 07/10/2014  . Lung nodule, multiple 07/10/2014  . CKD (chronic kidney disease)   . CKD (chronic kidney disease) stage 3, GFR 30-59 ml/min 06/20/2014  . Hardware complicating wound infection 06/19/2014  . Wound infection complicating hardware 05/29/2014  . Trimalleolar fracture of ankle, closed 05/24/2014    Ian Castagna PT, OCS 10/22/2014, 5:50 PM  [facility] [address]  [address] [place], [place], [address] Phone: [phone]   Fax:  [phone]

## 2014-10-24 ENCOUNTER — Ambulatory Visit: Payer: BLUE CROSS/BLUE SHIELD | Admitting: Rehabilitation

## 2014-10-24 ENCOUNTER — Encounter: Payer: Self-pay | Admitting: Rehabilitation

## 2014-10-24 DIAGNOSIS — R262 Difficulty in walking, not elsewhere classified: Secondary | ICD-10-CM

## 2014-10-24 DIAGNOSIS — R29898 Other symptoms and signs involving the musculoskeletal system: Secondary | ICD-10-CM

## 2014-10-24 DIAGNOSIS — M259 Joint disorder, unspecified: Secondary | ICD-10-CM | POA: Diagnosis not present

## 2014-10-24 DIAGNOSIS — M25671 Stiffness of right ankle, not elsewhere classified: Secondary | ICD-10-CM

## 2014-10-24 NOTE — Therapy (Signed)
[facility] [address]  [address] [place], [place], [address] Phone: [phone]   Fax:  [phone]  Physical Therapy Treatment  Patient Details  Name: Connor Frazier MRN: [id number] Date of Birth: January 15, 1956 Referring Provider:  Nadara Mustard, MD  Encounter Date: 10/24/2014      PT End of Session - 10/24/14 1556    Visit Number 12   Date for PT Re-Evaluation 10/30/14   PT Start Time 1530   PT Stop Time 1609   PT Time Calculation (min) 39 min   Activity Tolerance Patient tolerated treatment well      Past Medical History  Diagnosis Date  . PONV (postoperative nausea and vomiting)   . Elevated serum creatinine   . Hypercholesterolemia   . Chronic kidney disease     stage III- sees neph at [facility] , [facility]  . GERD (gastroesophageal reflux disease)     takes probiotics  . H/O cytopenia     Medication induced  . Septic embolism   . Lung infection 2016  . 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 CAD in native artery 09/23/2014  . NAFLD (nonalcoholic fatty liver disease) 02/03/7251  . Valvular heart disease 09/23/2014    Past Surgical History  Procedure Laterality Date  . Appendectomy    . Colonoscopy    . Dental surgery      implants   . Orif ankle fracture Right 05/24/2014    Procedure: OPEN REDUCTION INTERNAL FIXATION (ORIF) ANKLE FRACTURE;  Surgeon: Nadara Mustard, MD;  [facility];  Service: Orthopedics;  Laterality: Right;  . Irrigation and debridement abscess Right 05/29/2014    rt ankle   . I&d extremity Right 05/29/2014    Procedure: IRRIGATION AND DEBRIDEMENT RIGHT ANKLE AND PLACE ANTIBIOTIC BEADS;  Surgeon: Nadara Mustard, MD;  [facility];  Service: Orthopedics;  Laterality: Right;  . Hardware removal Right 06/21/2014    Procedure: Removal Hardware Right Ankle, Place Antibiotic Beads, and Wound VAC;  Surgeon: Nadara Mustard, MD;  [facility];  Service: Orthopedics;  Laterality: Right;  .  Peripherally inserted central catheter insertion    . Picc line removal ([facility] hx)    . Peripherally inserted central catheter insertion    . Hardware removal Right 08/28/2014    Procedure: Removal Deep Hardware Right Ankle;  Surgeon: Nadara Mustard, MD;  Location: [facility] OR;  Service: Orthopedics;  Laterality: Right;    There were no vitals filed for this visit.  Visit Diagnosis:  Ankle weakness  Weakness of right hip  Difficulty walking  Ankle stiffness, right      Subjective Assessment - 10/24/14 1529    Subjective no ankle issues.  not even swollen today.  back is bothering him   Currently in Pain? No/denies   Pain Location Ankle   Pain Orientation Right         TODAY'S TREATMENT  TherEx - NuStep lvl 5, 4'  Ankle DF Rocker Stretch 2x20"  TRX DL Squat with Heel Raise at top 20x  Bridge on Heels 10x5"  Bridge position march x 10 bil  R SL Bridge with foot on Blue Disc 10x2"  Piriformis stretch for LB after bridging x 30"   Prone plank on toes with ankles slightly PF 3x10"  Side Bridge into star (elbow and knee) 10x each  Gait on toes FW/backward 2x10 steps  On trampoline march without hand hold x 30 seconds  Trampoline SL stance work Freight forwarder,  able to get 11seconds today  9" BW stepdown onto R forefoot 12x (still very difficult and tends to FW shift upper body over L  foot despite VC and TC)  Star taps with 4 cones R SL stance x 5 and reaches attempted but too much on back                             PT Short Term Goals - 10/24/14 1533    PT SHORT TERM GOAL #1   Title pt independent in intial HEP by 09/27/14   Status Achieved           PT Long Term Goals - 10/24/14 1533    PT LONG TERM GOAL #1   Title pt able to return to regular exercise and participation in martial arts training by 10/30/14   Status On-going   PT LONG TERM GOAL #2   Title pt able to jog up to 5 minutes and hop without limitation by weakness or pain by 10/30/14   Status  On-going   PT LONG TERM GOAL #3   Title pt displays R Hip and Ankle MMT 4+/5 or better grossly by 10/30/14   Status On-going               Plan - 10/24/14 1610    Clinical Impression Statement improved status today. single leg stance time and tolerance improving.  would like to continue with therapy to work towards ability to perform sparring and jumping.     PT Next Visit Plan Continue hip and ankle strength and stability training; balance and gait training; manual and modalities as needed.   Consulted and Agree with Plan of Care Patient        Problem List Patient Active Problem List   Diagnosis Date Noted  . 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 CAD in native artery 09/23/2014  . NAFLD (nonalcoholic fatty liver disease) 16/11/9602  . Valvular heart disease 09/23/2014  . Septic embolism 08/06/2014  . Chronic kidney disease (CKD), stage II (mild) 07/10/2014  . Bone infection 07/10/2014  . Lung nodule, multiple 07/10/2014  . CKD (chronic kidney disease)   . CKD (chronic kidney disease) stage 3, GFR 30-59 ml/min 06/20/2014  . Hardware complicating wound infection 06/19/2014  . Wound infection complicating hardware 05/29/2014  . Trimalleolar fracture of ankle, closed 05/24/2014    Idamae Lusher, DPT, CMP 10/24/2014, 4:13 PM  [facility] [address]  [address] [place], [place], [address] Phone: [phone]   Fax:  [phone]

## 2014-10-29 ENCOUNTER — Ambulatory Visit: Payer: BLUE CROSS/BLUE SHIELD | Admitting: Rehabilitation

## 2014-10-29 DIAGNOSIS — M259 Joint disorder, unspecified: Secondary | ICD-10-CM | POA: Diagnosis not present

## 2014-10-29 DIAGNOSIS — R29898 Other symptoms and signs involving the musculoskeletal system: Secondary | ICD-10-CM

## 2014-10-29 DIAGNOSIS — R262 Difficulty in walking, not elsewhere classified: Secondary | ICD-10-CM

## 2014-10-29 DIAGNOSIS — R269 Unspecified abnormalities of gait and mobility: Secondary | ICD-10-CM

## 2014-10-29 NOTE — Therapy (Signed)
[facility] [address]  [place], [place], [address] Phone: [phone]   Fax:  [phone]  Physical Therapy Treatment  Patient Details  Name: Connor Frazier MRN: [id number] Date of Birth: [birth year] Referring Provider:  Newt Minion, MD  Encounter Date: 10/29/2014      PT End of Session - 10/29/14 1523    Visit Number 13   Number of Visits 20   Date for PT Re-Evaluation 11/27/14   PT Start Time 1525   PT Stop Time 1608   PT Time Calculation (min) 43 min   Activity Tolerance Patient tolerated treatment well   Behavior During Therapy [facility] for tasks assessed/performed      Past Medical History  Diagnosis Date  . PONV (postoperative nausea and vomiting)   . Elevated serum creatinine   . Hypercholesterolemia   . Chronic kidney disease     stage III- sees neph at [facility] , [facility]  . GERD (gastroesophageal reflux disease)     takes probiotics  . H/O cytopenia     Medication induced  . Septic embolism   . Lung infection 2016  . 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 CAD in native artery 09/23/2014  . NAFLD (nonalcoholic fatty liver disease) 09/23/2014  . Valvular heart disease 09/23/2014    Past Surgical History  Procedure Laterality Date  . Appendectomy    . Colonoscopy    . Dental surgery      implants   . Orif ankle fracture Right 05/24/2014    Procedure: OPEN REDUCTION INTERNAL FIXATION (ORIF) ANKLE FRACTURE;  Surgeon: Newt Minion, MD;  Location: [place];  Service: Orthopedics;  Laterality: Right;  . Irrigation and debridement abscess Right 05/29/2014    rt ankle   . I&d extremity Right 05/29/2014    Procedure: IRRIGATION AND DEBRIDEMENT RIGHT ANKLE AND PLACE ANTIBIOTIC BEADS;  Surgeon: Newt Minion, MD;  Location: [place];  Service: Orthopedics;  Laterality: Right;  . Hardware removal Right 06/21/2014    Procedure: Removal Hardware Right Ankle, Place Antibiotic Beads, and Wound VAC;  Surgeon:  Newt Minion, MD;  Location: [place];  Service: Orthopedics;  Laterality: Right;  . Peripherally inserted central catheter insertion    . Picc line removal ([facility] hx)    . Peripherally inserted central catheter insertion    . Hardware removal Right 08/28/2014    Procedure: Removal Deep Hardware Right Ankle;  Surgeon: Newt Minion, MD;  Location: [place];  Service: Orthopedics;  Laterality: Right;    There were no vitals filed for this visit.  Visit Diagnosis:  Ankle weakness  Weakness of right hip  Difficulty walking  Abnormality of gait      Subjective Assessment - 10/29/14 1527    Subjective Denies he has pain but does note increased swelling after working and being on his feet a lot. Still reports he can't do a single leg toe raise, running or jumping exercises and still has some numbness in his ankle on either side of the incision. Feels 75% back to normal.    Currently in Pain? No/denies            [facility] PT Assessment - 10/29/14 1528    Strength   Strength Assessment Site Hip   Right/Left Hip Right   Right Hip Flexion --  5-/5   Right Hip Extension 4+/5   Right Hip ABduction --  5-/5   Right Ankle Dorsiflexion 5/5   Right Ankle  Plantar Flexion 3-/5   Right Ankle Inversion 4+/5   Right Ankle Eversion 4+/5      TODAY'S TREATMENT TherEx - Rec Bike Int 3/5 x 6'  MMT Ankle DF Rocker Stretch 3x20" 9" BW stepdown onto R forefoot 12x TRX DL Squat with Heel Raise at top 20x TRX DL Squat with Hop 10x with pause after the jump, then 10x without pause Foam Beam F/B tandem gait 4 laps then 4 laps on toes FW only Gait through halls, up to third floor then down to second. (able to ascend and descend stairs with reciprocal pattern but notices decrease DF with descending stairs and pt noticing weakness and discomfort with this motion. Gait has decreased stance time and stride length on the Lt. Pt able to correct with shorter strides on each side and focus on toe off/push off)    Star Taps with 3 cones (fwd/side/bwd) Rt SL stance x5  Fwd/Bwd gait in halls with focusing on push off/toe off       PT Short Term Goals - 10/24/14 1533    PT SHORT TERM GOAL #1   Title pt independent in intial HEP by 09/27/14   Status Achieved           PT Long Term Goals - 10/29/14 1015    PT LONG TERM GOAL #1   Title pt able to return to regular exercise and participation in martial arts training by 11/27/14   Status On-going   PT LONG TERM GOAL #2   Title pt able to jog up to 5 minutes and hop without limitation by weakness or pain by 11/27/14   Status On-going   PT LONG TERM GOAL #3   Title pt displays R Hip and Ankle MMT 4+/5 or better grossly by 11/27/14  met other than ankle PF   Status Partially Met   PT LONG TERM GOAL #4   Title pt able to ambulate over level and uneven terrain to include up/down stairs with good mechanics by 11/27/14   Status New               Plan - 10/29/14 1656    Clinical Impression Statement Pt still presents with decreased hip and ankle strength, especially noted with ankle PF. Deficits with ambulation seems to revolve around the PF weakness.  Gait displays short stride on L and pt with significant difficulty decending stairs both of which seem due to PF weakness on R.  He reports feeling about 75% back to normal and is most concerned with returning to marital arts and being able to spar and jump as he needs. He has been able to complete squat hops with the TRX but cannot perform with forefoot landing and has guarded push-off again likely due to PF weakness.  I am recommending continued PT at 2x/wk for 4 more weeks to address remaining issues and work towards goals.   Pt will benefit from skilled therapeutic intervention in order to improve on the following deficits Abnormal gait;Decreased range of motion;Difficulty walking;Pain;Impaired flexibility;Decreased strength;Hypomobility;Decreased balance   PT Frequency 2x / week   PT Duration 4  weeks   PT Treatment/Interventions Therapeutic exercise;Therapeutic activities;Balance training;Manual techniques;Neuromuscular re-education;Gait training;Stair training;Taping;Vasopneumatic Device;Electrical Stimulation   PT Next Visit Plan Continue hip and ankle strength and stability training; balance and gait training; manual and modalities as needed.   Consulted and Agree with Plan of Care Patient        Problem List Patient Active Problem List   Diagnosis Date  Noted  . 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 CAD in native artery 09/23/2014  . NAFLD (nonalcoholic fatty liver disease) 09/23/2014  . Valvular heart disease 09/23/2014  . Septic embolism 08/06/2014  . Chronic kidney disease (CKD), stage II (mild) 07/10/2014  . Bone infection 07/10/2014  . Lung nodule, multiple 07/10/2014  . CKD (chronic kidney disease)   . CKD (chronic kidney disease) stage 3, GFR 30-59 ml/min 06/20/2014  . Hardware complicating wound infection 06/19/2014  . Wound infection complicating hardware 22/41/1464  . Trimalleolar fracture of ankle, closed 05/24/2014    Barbette Hair, PTA 10/30/2014, 10:18 AM  Leonette Most PT, OCS 10/30/2014 10:18 AM   [facility] [address]  [place], [place], [address] Phone: [phone]   Fax:  [phone]

## 2014-11-05 ENCOUNTER — Ambulatory Visit: Payer: BLUE CROSS/BLUE SHIELD | Attending: Orthopedic Surgery | Admitting: Rehabilitation

## 2014-11-05 DIAGNOSIS — R269 Unspecified abnormalities of gait and mobility: Secondary | ICD-10-CM | POA: Insufficient documentation

## 2014-11-05 DIAGNOSIS — M25671 Stiffness of right ankle, not elsewhere classified: Secondary | ICD-10-CM | POA: Diagnosis present

## 2014-11-05 DIAGNOSIS — R262 Difficulty in walking, not elsewhere classified: Secondary | ICD-10-CM | POA: Diagnosis present

## 2014-11-05 DIAGNOSIS — M259 Joint disorder, unspecified: Secondary | ICD-10-CM | POA: Insufficient documentation

## 2014-11-05 DIAGNOSIS — R29898 Other symptoms and signs involving the musculoskeletal system: Secondary | ICD-10-CM | POA: Diagnosis present

## 2014-11-05 NOTE — Therapy (Signed)
[facility] [address]  [place], [place], [address] Phone: [phone]   Fax:  [phone]  Physical Therapy Treatment  Patient Details  Name: Connor Frazier MRN: [id number] Date of Birth: 1955/11/14 Referring Provider:  Newt Minion, MD  Encounter Date: 11/05/2014      PT End of Session - 11/05/14 1444    Visit Number 14   Number of Visits 20   Date for PT Re-Evaluation 11/27/14   PT Start Time 0867   PT Stop Time 1525   PT Time Calculation (min) 40 min      Past Medical History  Diagnosis Date  . PONV (postoperative nausea and vomiting)   . Elevated serum creatinine   . Hypercholesterolemia   . Chronic kidney disease     stage III- sees neph at [facility] , [facility]  . GERD (gastroesophageal reflux disease)     takes probiotics  . H/O cytopenia     Medication induced  . Septic embolism   . Lung infection 2016  . 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 CAD in native artery 09/23/2014  . NAFLD (nonalcoholic fatty liver disease) 09/23/2014  . Valvular heart disease 09/23/2014    Past Surgical History  Procedure Laterality Date  . Appendectomy    . Colonoscopy    . Dental surgery      implants   . Orif ankle fracture Right 05/24/2014    Procedure: OPEN REDUCTION INTERNAL FIXATION (ORIF) ANKLE FRACTURE;  Surgeon: Newt Minion, MD;  Location: [place];  Service: Orthopedics;  Laterality: Right;  . Irrigation and debridement abscess Right 05/29/2014    rt ankle   . I&d extremity Right 05/29/2014    Procedure: IRRIGATION AND DEBRIDEMENT RIGHT ANKLE AND PLACE ANTIBIOTIC BEADS;  Surgeon: Newt Minion, MD;  Location: [place];  Service: Orthopedics;  Laterality: Right;  . Hardware removal Right 06/21/2014    Procedure: Removal Hardware Right Ankle, Place Antibiotic Beads, and Wound VAC;  Surgeon: Newt Minion, MD;  Location: [place];  Service: Orthopedics;  Laterality: Right;  . Peripherally inserted central  catheter insertion    . Picc line removal ([facility] hx)    . Peripherally inserted central catheter insertion    . Hardware removal Right 08/28/2014    Procedure: Removal Deep Hardware Right Ankle;  Surgeon: Newt Minion, MD;  Location: [place];  Service: Orthopedics;  Laterality: Right;    There were no vitals filed for this visit.  Visit Diagnosis:  Ankle weakness  Weakness of right hip  Difficulty walking  Abnormality of gait      Subjective Assessment - 11/05/14 1446    Subjective Notes pain in the bottom of both feet and thinks its from working a lot on concrete floors. Continues to note intermittent LBP.    Currently in Pain? Yes   Pain Score --  1-2/10   Pain Location Ankle   Pain Orientation Right;Left;Posterior  bottom bilateral feet      TODAY'S TREATMENT TherEx - Rec Bike Int 3/5 x 6'  Ankle DF Rocker Stretch 3x20", Bilateral Fwd/Bwd gait in halls with cook band and focusing on push off/toe off 60 ft x2 each way 9" BW stepdown onto R forefoot 15x Prone plank on toes with ankles slightly PF 3x15" Side Bridge into star (elbow and knee) 10x each Foam Beam side-stepping 4 laps, F/B tandem gait 4 laps, 4 laps on toes FW only DL Heel Raises at UBE 15x, then  eccentric heel raises (DL up, Rt down) 10x (difficult but pt able to complete) TRX DL Squat with Heel Raise at top 20x       PT Short Term Goals - 11/05/14 1454    PT SHORT TERM GOAL #1   Title pt independent in intial HEP by 09/27/14   Status Achieved           PT Long Term Goals - 11/05/14 1454    PT LONG TERM GOAL #1   Title pt able to return to regular exercise and participation in martial arts training by 11/27/14   Status On-going   PT LONG TERM GOAL #2   Title pt able to jog up to 5 minutes and hop without limitation by weakness or pain by 11/27/14   Status On-going   PT LONG TERM GOAL #3   Title pt displays R Hip and Ankle MMT 4+/5 or better grossly by 11/27/14   Status Partially Met   PT LONG  TERM GOAL #4   Title pt able to ambulate over level and uneven terrain to include up/down stairs with good mechanics by 11/27/14   Status On-going               Plan - 11/05/14 1525    Clinical Impression Statement Pt demonstrated good control with the first few reps of eccentric heel raises then he began to fatigue but was able to complete all 10 with UE assist. Able to perform better push off today with gait in halls with cook band compared to last visit. Did not perform hopping exercises today but continued with hip and ankle strengthening/stability.    PT Next Visit Plan Continue hip and ankle strength and stability training; balance and gait training; manual and modalities as needed.   Consulted and Agree with Plan of Care Patient        Problem List Patient Active Problem List   Diagnosis Date Noted  . 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 CAD in native artery 09/23/2014  . NAFLD (nonalcoholic fatty liver disease) 09/23/2014  . Valvular heart disease 09/23/2014  . Septic embolism ([place]) 08/06/2014  . Chronic kidney disease (CKD), stage II (mild) 07/10/2014  . Bone infection ([place]) 07/10/2014  . Lung nodule, multiple 07/10/2014  . CKD (chronic kidney disease)   . CKD (chronic kidney disease) stage 3, GFR 30-59 ml/min 06/20/2014  . Hardware complicating wound infection ([place]) 06/19/2014  . Wound infection complicating hardware ([place]) 05/29/2014  . Trimalleolar fracture of ankle, closed 05/24/2014    Barbette Hair, PTA 11/05/2014, 3:27 PM  [facility] [address]  [place], [place], [address] Phone: [phone]   Fax:  [phone]

## 2014-11-07 ENCOUNTER — Ambulatory Visit: Payer: BLUE CROSS/BLUE SHIELD | Admitting: Physical Therapy

## 2014-11-07 DIAGNOSIS — R29898 Other symptoms and signs involving the musculoskeletal system: Secondary | ICD-10-CM

## 2014-11-07 DIAGNOSIS — R262 Difficulty in walking, not elsewhere classified: Secondary | ICD-10-CM

## 2014-11-07 DIAGNOSIS — R269 Unspecified abnormalities of gait and mobility: Secondary | ICD-10-CM

## 2014-11-07 DIAGNOSIS — M25671 Stiffness of right ankle, not elsewhere classified: Secondary | ICD-10-CM

## 2014-11-07 DIAGNOSIS — M259 Joint disorder, unspecified: Secondary | ICD-10-CM | POA: Diagnosis not present

## 2014-11-07 NOTE — Therapy (Signed)
[facility] [address]  [place], [place], [address] Phone: [phone]   Fax:  [phone]  Physical Therapy Treatment  Patient Details  Name: Connor Frazier MRN: [id number] Date of Birth: 03-Dec-1955 Referring Provider:  Newt Minion, MD  Encounter Date: 11/07/2014      PT End of Session - 11/07/14 1722    Visit Number 15   Number of Visits 20   Date for PT Re-Evaluation 11/27/14   PT Start Time 2119   PT Stop Time 1755   PT Time Calculation (min) 50 min      Past Medical History  Diagnosis Date  . PONV (postoperative nausea and vomiting)   . Elevated serum creatinine   . Hypercholesterolemia   . Chronic kidney disease     stage III- sees neph at [facility] , [facility]  . GERD (gastroesophageal reflux disease)     takes probiotics  . H/O cytopenia     Medication induced  . Septic embolism   . Lung infection 2016  . 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 CAD in native artery 09/23/2014  . NAFLD (nonalcoholic fatty liver disease) 09/23/2014  . Valvular heart disease 09/23/2014    Past Surgical History  Procedure Laterality Date  . Appendectomy    . Colonoscopy    . Dental surgery      implants   . Orif ankle fracture Right 05/24/2014    Procedure: OPEN REDUCTION INTERNAL FIXATION (ORIF) ANKLE FRACTURE;  Surgeon: Newt Minion, MD;  Location: [facility];  Service: Orthopedics;  Laterality: Right;  . Irrigation and debridement abscess Right 05/29/2014    rt ankle   . I&d extremity Right 05/29/2014    Procedure: IRRIGATION AND DEBRIDEMENT RIGHT ANKLE AND PLACE ANTIBIOTIC BEADS;  Surgeon: Newt Minion, MD;  Location: [facility];  Service: Orthopedics;  Laterality: Right;  . Hardware removal Right 06/21/2014    Procedure: Removal Hardware Right Ankle, Place Antibiotic Beads, and Wound VAC;  Surgeon: Newt Minion, MD;  Location: [facility];  Service: Orthopedics;  Laterality: Right;  . Peripherally inserted central  catheter insertion    . Picc line removal ([facility] hx)    . Peripherally inserted central catheter insertion    . Hardware removal Right 08/28/2014    Procedure: Removal Deep Hardware Right Ankle;  Surgeon: Newt Minion, MD;  Location: [facility] [place];  Service: Orthopedics;  Laterality: Right;    There were no vitals filed for this visit.  Visit Diagnosis:  Ankle weakness  Weakness of right hip  Difficulty walking  Abnormality of gait  Ankle stiffness, right          [facility] PT Assessment - 11/07/14 0001    Strength   Right Ankle Dorsiflexion 5/5   Right Ankle Plantar Flexion 3/5   Right Ankle Inversion 5/5   Right Ankle Eversion 5/5         TODAY'S TREATMENT TherEx - Rec Bike Int 3/5, 5' Rocker DF stretch 2x20" Ankle MMT assessment MiniTramp DL Hop 20"; DL s/s Hop 20"; LE Jacks 20" 9" R step-up with heel off and L Hip flexion to 90 15x with single pole A 8" step lateral lunges to forefot 15x each TRX SL MiniSquat with R foot on Black Disc 10x BAPS - standing lvl 4 PF/DF with 5# each of 4 locations other than P with B Pole A and light L toes A TM - 5-12% at 3.46mh x 3.5' R ITB Foam Roller  and Strumming instruction                      PT Short Term Goals - 11/05/14 1454    PT SHORT TERM GOAL #1   Title pt independent in intial HEP by 09/27/14   Status Achieved           PT Long Term Goals - 11/05/14 1454    PT LONG TERM GOAL #1   Title pt able to return to regular exercise and participation in martial arts training by 11/27/14   Status On-going   PT LONG TERM GOAL #2   Title pt able to jog up to 5 minutes and hop without limitation by weakness or pain by 11/27/14   Status On-going   PT LONG TERM GOAL #3   Title pt displays R Hip and Ankle MMT 4+/5 or better grossly by 11/27/14   Status Partially Met   PT LONG TERM GOAL #4   Title pt able to ambulate over level and uneven terrain to include up/down stairs with good mechanics by 11/27/14   Status  On-going               Plan - 11/07/14 1800    Clinical Impression Statement pt able to perform R SL heel raise for first time today with good ROM.  Reps limited to 1-2 then tends to bend knee to compensate for PF weakness.  Overall, excellent progress to date and will focus more and more on agility and hopping as his tolerance improves.  Pt with c/o R lateral hip/thigh pain lately.  POS Obers today and TTP to ITB so instructed in foam roller and STM / strumming to area with wife assist.   PT Next Visit Plan Progress hopping and agility training; continue hip and ankle stability training as well as proprioception training   Consulted and Agree with Plan of Care Patient        Problem List Patient Active Problem List   Diagnosis Date Noted  . 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 CAD in native artery 09/23/2014  . NAFLD (nonalcoholic fatty liver disease) 09/23/2014  . Valvular heart disease 09/23/2014  . Septic embolism ([place]) 08/06/2014  . Chronic kidney disease (CKD), stage II (mild) 07/10/2014  . Bone infection ([place]) 07/10/2014  . Lung nodule, multiple 07/10/2014  . CKD (chronic kidney disease)   . CKD (chronic kidney disease) stage 3, GFR 30-59 ml/min 06/20/2014  . Hardware complicating wound infection ([place]) 06/19/2014  . Wound infection complicating hardware ([place]) 05/29/2014  . Trimalleolar fracture of ankle, closed 05/24/2014    Eadie Repetto PT, OCS 11/07/2014, 6:06 PM  [facility] [address]  [place], [place], [address] Phone: [phone]   Fax:  [phone]

## 2014-11-12 ENCOUNTER — Ambulatory Visit: Payer: BLUE CROSS/BLUE SHIELD | Admitting: Physical Therapy

## 2014-11-12 DIAGNOSIS — R29898 Other symptoms and signs involving the musculoskeletal system: Secondary | ICD-10-CM

## 2014-11-12 DIAGNOSIS — R262 Difficulty in walking, not elsewhere classified: Secondary | ICD-10-CM

## 2014-11-12 DIAGNOSIS — R269 Unspecified abnormalities of gait and mobility: Secondary | ICD-10-CM

## 2014-11-12 DIAGNOSIS — M25671 Stiffness of right ankle, not elsewhere classified: Secondary | ICD-10-CM

## 2014-11-12 DIAGNOSIS — M259 Joint disorder, unspecified: Secondary | ICD-10-CM | POA: Diagnosis not present

## 2014-11-12 NOTE — Therapy (Signed)
[facility] [address]  [place], [place], [address] Phone: [phone]   Fax:  [phone]  Physical Therapy Treatment  Patient Details  Name: Connor Frazier MRN: [id number] Date of Birth: Mar 23, 1955 Referring Provider:  Newt Minion, MD  Encounter Date: 11/12/2014      PT End of Session - 11/12/14 1459    Visit Number 16   Number of Visits 20   Date for PT Re-Evaluation 11/27/14   PT Start Time 5366   PT Stop Time 1531   PT Time Calculation (min) 37 min      Past Medical History  Diagnosis Date  . PONV (postoperative nausea and vomiting)   . Elevated serum creatinine   . Hypercholesterolemia   . Chronic kidney disease     stage III- sees neph at [facility] , [facility]  . GERD (gastroesophageal reflux disease)     takes probiotics  . H/O cytopenia     Medication induced  . Septic embolism   . Lung infection 2016  . 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 CAD in native artery 09/23/2014  . NAFLD (nonalcoholic fatty liver disease) 09/23/2014  . Valvular heart disease 09/23/2014    Past Surgical History  Procedure Laterality Date  . Appendectomy    . Colonoscopy    . Dental surgery      implants   . Orif ankle fracture Right 05/24/2014    Procedure: OPEN REDUCTION INTERNAL FIXATION (ORIF) ANKLE FRACTURE;  Surgeon: Newt Minion, MD;  Location: [facility];  Service: Orthopedics;  Laterality: Right;  . Irrigation and debridement abscess Right 05/29/2014    rt ankle   . I&d extremity Right 05/29/2014    Procedure: IRRIGATION AND DEBRIDEMENT RIGHT ANKLE AND PLACE ANTIBIOTIC BEADS;  Surgeon: Newt Minion, MD;  Location: [facility];  Service: Orthopedics;  Laterality: Right;  . Hardware removal Right 06/21/2014    Procedure: Removal Hardware Right Ankle, Place Antibiotic Beads, and Wound VAC;  Surgeon: Newt Minion, MD;  Location: [facility];  Service: Orthopedics;  Laterality: Right;  . Peripherally inserted central  catheter insertion    . Picc line removal ([facility] hx)    . Peripherally inserted central catheter insertion    . Hardware removal Right 08/28/2014    Procedure: Removal Deep Hardware Right Ankle;  Surgeon: Newt Minion, MD;  Location: [facility];  Service: Orthopedics;  Laterality: Right;    There were no vitals filed for this visit.  Visit Diagnosis:  Ankle weakness  Weakness of right hip  Difficulty walking  Abnormality of gait  Ankle stiffness, right      Subjective Assessment - 11/12/14 1511    Subjective States thigh/hip as well as lower back pain is improving.  No pain in ankle today.   Currently in Pain? No/denies         TODAY'S TREATMENT TherEx - Rec Bike Int 3/5, 5' DL Heel Raise at UBE 20x TRX DL Squat 10x TRX DL Squat with Heel Raise 10x TRX DL Squat with Hop 20x TRX Lateral SL Hopping 20x each TRX Switch stance hopping 10x DF Rocker Stretch 2x20" each DL Heel Raise at UBE in, out, and normal 15x each (45 reps total) Toe Lunge Isometric hold with B Shoulder ABD 5# 2x10 with R lead and 2x10 with L lead R SLS L toe tapping to 5 cones 4 laps with increasing distance Rebounder narrow standing on BOSU (down) with chest toss/catch 2 kg 30x  PT Short Term Goals - 11/05/14 1454    PT SHORT TERM GOAL #1   Title pt independent in intial HEP by 09/27/14   Status Achieved           PT Long Term Goals - 11/05/14 1454    PT LONG TERM GOAL #1   Title pt able to return to regular exercise and participation in martial arts training by 11/27/14   Status On-going   PT LONG TERM GOAL #2   Title pt able to jog up to 5 minutes and hop without limitation by weakness or pain by 11/27/14   Status On-going   PT LONG TERM GOAL #3   Title pt displays R Hip and Ankle MMT 4+/5 or better grossly by 11/27/14   Status Partially Met   PT LONG TERM GOAL #4   Title pt able to ambulate over level and uneven terrain to include up/down stairs with good mechanics by  11/27/14   Status On-going               Plan - 11/12/14 1527    Clinical Impression Statement pt is displaying significant improvements lately with regard to ankle strength and proprioception.  Remains very motivated and pushes self hard every treatment.  Pt hoping to return to martial arts at low intensity soon and will likely d/c PT at that point.   PT Next Visit Plan Progress hopping and agility training; continue hip and ankle stability training as well as proprioception training   Consulted and Agree with Plan of Care Patient        Problem List Patient Active Problem List   Diagnosis Date Noted  . 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 CAD in native artery 09/23/2014  . NAFLD (nonalcoholic fatty liver disease) 09/23/2014  . Valvular heart disease 09/23/2014  . Septic embolism ([place]) 08/06/2014  . Chronic kidney disease (CKD), stage II (mild) 07/10/2014  . Bone infection ([place]) 07/10/2014  . Lung nodule, multiple 07/10/2014  . CKD (chronic kidney disease)   . CKD (chronic kidney disease) stage 3, GFR 30-59 ml/min 06/20/2014  . Hardware complicating wound infection ([place]) 06/19/2014  . Wound infection complicating hardware ([place]) 05/29/2014  . Trimalleolar fracture of ankle, closed 05/24/2014    Aradhana Gin PT, OCS 11/12/2014, 3:37 PM  [facility] [address]  [place], [place], [address] Phone: [phone]   Fax:  [phone]

## 2014-11-13 ENCOUNTER — Encounter: Payer: Self-pay | Admitting: Infectious Disease

## 2014-11-14 ENCOUNTER — Ambulatory Visit: Payer: BLUE CROSS/BLUE SHIELD | Admitting: Physical Therapy

## 2014-11-14 DIAGNOSIS — M25671 Stiffness of right ankle, not elsewhere classified: Secondary | ICD-10-CM

## 2014-11-14 DIAGNOSIS — R262 Difficulty in walking, not elsewhere classified: Secondary | ICD-10-CM

## 2014-11-14 DIAGNOSIS — R29898 Other symptoms and signs involving the musculoskeletal system: Secondary | ICD-10-CM

## 2014-11-14 DIAGNOSIS — M259 Joint disorder, unspecified: Secondary | ICD-10-CM | POA: Diagnosis not present

## 2014-11-14 DIAGNOSIS — R269 Unspecified abnormalities of gait and mobility: Secondary | ICD-10-CM

## 2014-11-14 NOTE — Therapy (Addendum)
[facility] [address]  [place], [place], [address] Phone: [phone]   Fax:  [phone]  Physical Therapy Treatment  Patient Details  Name: Connor Frazier MRN: [id number] Date of Birth: 03/18/1955 Referring Provider:  Newt Minion, MD  Encounter Date: 11/14/2014      PT End of Session - 11/14/14 1537    Visit Number 17   Number of Visits 20   Date for PT Re-Evaluation 11/27/14   PT Start Time 43      Past Medical History  Diagnosis Date  . PONV (postoperative nausea and vomiting)   . Elevated serum creatinine   . Hypercholesterolemia   . Chronic kidney disease     stage III- sees neph at [facility] , [facility]  . GERD (gastroesophageal reflux disease)     takes probiotics  . H/O cytopenia     Medication induced  . Septic embolism   . Lung infection 2016  . 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 CAD in native artery 09/23/2014  . NAFLD (nonalcoholic fatty liver disease) 09/23/2014  . Valvular heart disease 09/23/2014    Past Surgical History  Procedure Laterality Date  . Appendectomy    . Colonoscopy    . Dental surgery      implants   . Orif ankle fracture Right 05/24/2014    Procedure: OPEN REDUCTION INTERNAL FIXATION (ORIF) ANKLE FRACTURE;  Surgeon: Newt Minion, MD;  Location: [facility];  Service: Orthopedics;  Laterality: Right;  . Irrigation and debridement abscess Right 05/29/2014    rt ankle   . I&d extremity Right 05/29/2014    Procedure: IRRIGATION AND DEBRIDEMENT RIGHT ANKLE AND PLACE ANTIBIOTIC BEADS;  Surgeon: Newt Minion, MD;  Location: [facility];  Service: Orthopedics;  Laterality: Right;  . Hardware removal Right 06/21/2014    Procedure: Removal Hardware Right Ankle, Place Antibiotic Beads, and Wound VAC;  Surgeon: Newt Minion, MD;  Location: [facility];  Service: Orthopedics;  Laterality: Right;  . Peripherally inserted central catheter insertion    . Picc line removal ([facility] hx)    .  Peripherally inserted central catheter insertion    . Hardware removal Right 08/28/2014    Procedure: Removal Deep Hardware Right Ankle;  Surgeon: Newt Minion, MD;  Location: [facility];  Service: Orthopedics;  Laterality: Right;    There were no vitals filed for this visit.  Visit Diagnosis:  Ankle weakness  Weakness of right hip  Difficulty walking  Abnormality of gait  Ankle stiffness, right      Subjective Assessment - 11/14/14 1537    Subjective no pain   Currently in Pain? No/denies            [facility] PT Assessment - 11/14/14 0001    Strength   Right Hip Flexion 5/5   Right Hip Extension 5/5   Right Hip External Rotation  5/5   Right Hip Internal Rotation 5/5   Right Hip ABduction 5/5   Right Hip ADduction 5/5   Right Ankle Dorsiflexion 5/5   Right Ankle Plantar Flexion 4/5  is able to complete 20 heel raises on R but decreasing ROM   Right Ankle Inversion 5/5   Right Ankle Eversion 5/5          TODAY'S TREATMENT TherEx - Elliptical lvl 1.5, 3' MMT Assessment Gait Assessment in halls and stairs DL hopping 20" [employer] f/b and s/s 20" each Broad Jump drills/assessment SL Broad Jump L  111.0, 100.5 SL Broad Jump R 50.5, 52.0 (~48%) Triple SL Broad Jump L 208.5, 213.5  422 Triple SL Broad Jump R 133.0, 138.0 (~64%)           PT Short Term Goals - 11/05/14 1454    PT SHORT TERM GOAL #1   Title pt independent in intial HEP by 09/27/14   Status Achieved           PT Long Term Goals - 11/14/14 1547    PT LONG TERM GOAL #1   Title pt able to return to regular exercise and participation in martial arts training by 11/27/14   Status Partially Met   PT LONG TERM GOAL #2   Title pt able to jog up to 5 minutes and hop without limitation by weakness or pain by 11/27/14   Status Partially Met   PT LONG TERM GOAL #3   Title pt displays R Hip and Ankle MMT 4+/5 or better grossly by 11/27/14   Status Achieved   PT LONG TERM GOAL #4    Title pt able to ambulate over level and uneven terrain to include up/down stairs with good mechanics by 11/27/14   Status Achieved               Problem List Patient Active Problem List   Diagnosis Date Noted  . 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 CAD in native artery 09/23/2014  . NAFLD (nonalcoholic fatty liver disease) 09/23/2014  . Valvular heart disease 09/23/2014  . Septic embolism ([place]) 08/06/2014  . Chronic kidney disease (CKD), stage II (mild) 07/10/2014  . Bone infection ([place]) 07/10/2014  . Lung nodule, multiple 07/10/2014  . CKD (chronic kidney disease)   . CKD (chronic kidney disease) stage 3, GFR 30-59 ml/min 06/20/2014  . Hardware complicating wound infection ([place]) 06/19/2014  . Wound infection complicating hardware ([place]) 05/29/2014  . Trimalleolar fracture of ankle, closed 05/24/2014    Ronney Honeywell PT, OCS 11/14/2014, 4:01 PM  [facility] [address]  [place], [place], [address] Phone: [phone]   Fax:  [phone]    PHYSICAL THERAPY DISCHARGE SUMMARY  Visits from Start of Care: 17  Current functional level related to goals / functional outcomes: Mr. Kathyrn Sheriff was last seen on 11/14/14.  At that time he was feeling very good and had no c/o pain.  He stated he was going to return to martial arts training at a modified level of participation following that treatment.  He was advised that if he didn't tolerate that and his pain returned to contact us to complete his remaining PT treatments.  He did not contact us so it would appear his martial arts training and HEP are going well.   Remaining deficits: R Ankle PFMMT 4/5, R LE jumping 48-64% of L   Education / Equipment: HEP, activity progression Plan: Patient agrees to discharge.  Patient goals were partially met. Patient is being discharged due to being pleased with the current functional level.  ?????        Leonette Most PT,  OCS 12/24/2014  9:16 AM

## 2014-12-20 ENCOUNTER — Encounter: Payer: Self-pay | Admitting: Infectious Disease

## 2015-01-02 ENCOUNTER — Encounter: Payer: Self-pay | Admitting: Infectious Disease

## 2015-01-02 ENCOUNTER — Ambulatory Visit (INDEPENDENT_AMBULATORY_CARE_PROVIDER_SITE_OTHER): Payer: BLUE CROSS/BLUE SHIELD | Admitting: Infectious Disease

## 2015-01-02 VITALS — BP 126/81 | HR 63 | Temp 98.0°F | Wt 211.0 lb

## 2015-01-02 DIAGNOSIS — R918 Other nonspecific abnormal finding of lung field: Secondary | ICD-10-CM

## 2015-01-02 DIAGNOSIS — N183 Chronic kidney disease, stage 3 unspecified: Secondary | ICD-10-CM

## 2015-01-02 DIAGNOSIS — I2690 Septic pulmonary embolism without acute cor pulmonale: Secondary | ICD-10-CM | POA: Diagnosis not present

## 2015-01-02 DIAGNOSIS — I76 Septic arterial embolism: Secondary | ICD-10-CM

## 2015-01-02 DIAGNOSIS — T847XXD Infection and inflammatory reaction due to other internal orthopedic prosthetic devices, implants and grafts, subsequent encounter: Secondary | ICD-10-CM | POA: Diagnosis not present

## 2015-01-02 DIAGNOSIS — I330 Acute and subacute infective endocarditis: Secondary | ICD-10-CM | POA: Diagnosis not present

## 2015-01-02 DIAGNOSIS — I38 Endocarditis, valve unspecified: Secondary | ICD-10-CM

## 2015-01-02 LAB — BASIC METABOLIC PANEL WITH GFR
BUN: 27 mg/dL — AB (ref 7–25)
CO2: 23 mmol/L (ref 20–31)
Calcium: 8.9 mg/dL (ref 8.6–10.3)
Chloride: 104 mmol/L (ref 98–110)
Creat: 1.45 mg/dL — ABNORMAL HIGH (ref 0.70–1.33)
GFR, Est African American: 61 mL/min (ref >=60)
GFR, Est Non African American: 52 mL/min — ABNORMAL LOW (ref >=60)
Glucose, Bld: 75 mg/dL (ref 65–99)
Potassium: 4.1 mmol/L (ref 3.5–5.3)
Sodium: 139 mmol/L (ref 135–146)

## 2015-01-02 LAB — C-REACTIVE PROTEIN: CRP: 0.5 mg/dL (ref <0.60)

## 2015-01-02 NOTE — Progress Notes (Signed)
Chief complaint: intermittent swelling of his ankle  Subjective:    Patient ID: Connor Frazier, male    DOB: 06-18-1955, 59 y.o.   MRN: [id number]  HPI  59 year old with hardware associated deep infection left ankle who was initially seen by my partner Connor. Connor Frazier in the hospital and also by myself in the hospital at [facility].  He had initial surgery on April the 26, 2016 with placement of antibiotic beads , and cultures that were obtained are unrevealingthen status post surgery on May 21, with removal of antibiotic beads and deep cultures obtained for aerobic anaerobic fungal and AFB cultures. None of these were revealing either. He was placed on IV daptomycin and ceftriaxone and discharged with plans to complete an 8 week course.  Unfortunately he was hospitalized at [facility] with flank pain and found to have on imaging by chest x-ray and CT scan evidence of what appeared to be likely septic emboli with nodular lesions with cavitation throughout the lungs. He had blood cultures obtained which were negative. He was seen by Connor. Connor Frazier with ID at [facility] and anti-biotic's were broadened to Zyvox and Zosyn. Patient had a transthoracic echocardiogram which failed to show any evidence of vegetations on his heart valves. He did not undergo a transesophageal echocardiogram. The central line was removed and apparently per the patient was thought to look infectious by the nurse who removed it. The catheter tip was cultured but no growth was obtained. The patient was then discharged to home on oral Zyvox and IV meropenem. Since then he developed thrombocytopenia with his platelets dipping to 114 when checked on June 27. Connor Frazier continue the patient's Zyvox and is remainder meropenem since then. I saw the patient in followup in July and added bactrim to give some MRSA Coag Neg staph coverage. He completed the merrem and remaind on bactrim but developed problems with renal function and  then changed over to doxycyline.  Since then he had surgery by Connor Frazier on 08/28/2014 who performed Removal of deep hardware right ankle.  He found no evidence of purulence in the OR. No cultures sent.  He  remained on doxycycline afterwards.  His ankle pain had improved substantially.  He saw his pulmonologist at [facility] who had CT lungs repeated and showed resolution of lung nodules.  I saw him a few months ago and we took him off of doxycycline.   He has since returned to work. He has noticed now increased swelling in his ankle he believes since he stopped the doxycycline.  He pain otherwise has improved though he is suffering from foot pain that is thought to be due to plantar fascitis he claims.  Past Medical History  Diagnosis Date  . PONV (postoperative nausea and vomiting)   . Elevated serum creatinine   . Hypercholesterolemia   . Chronic kidney disease     stage III- sees neph at [facility]  . GERD (gastroesophageal reflux disease)     takes probiotics  . H/O cytopenia     Medication induced  . Septic embolism ([place])   . Lung infection 2016  . 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 CAD in native artery 09/23/2014  . NAFLD (nonalcoholic fatty liver disease) 09/23/2014  . Valvular heart disease 09/23/2014    Past Surgical History  Procedure Laterality Date  . Appendectomy    . Colonoscopy    . Dental surgery      implants   . Orif ankle  fracture Right 05/24/2014    Procedure: OPEN REDUCTION INTERNAL FIXATION (ORIF) ANKLE FRACTURE;  Surgeon: Newt Minion, MD;  Location: [place];  Service: Orthopedics;  Laterality: Right;  . Irrigation and debridement abscess Right 05/29/2014    rt ankle   . I&d extremity Right 05/29/2014    Procedure: IRRIGATION AND DEBRIDEMENT RIGHT ANKLE AND PLACE ANTIBIOTIC BEADS;  Surgeon: Newt Minion, MD;  Location: [place];  Service: Orthopedics;  Laterality: Right;  . Hardware removal Right 06/21/2014    Procedure: Removal Hardware Right  Ankle, Place Antibiotic Beads, and Wound VAC;  Surgeon: Newt Minion, MD;  Location: [place];  Service: Orthopedics;  Laterality: Right;  . Peripherally inserted central catheter insertion    . Picc line removal ([facility] hx)    . Peripherally inserted central catheter insertion    . Hardware removal Right 08/28/2014    Procedure: Removal Deep Hardware Right Ankle;  Surgeon: Newt Minion, MD;  Location: [place];  Service: Orthopedics;  Laterality: Right;    Family History  Problem Relation Age of Onset  . Dementia Mother   . Diabetes Father   . Heart disease Father   . Cancer - Prostate Father       Social History   Social History  . Marital Status: Married    Spouse Name: N/A  . Number of Children: N/A  . Years of Education: N/A   Social History Main Topics  . Smoking status: Never Smoker   . Smokeless tobacco: Never Used  . Alcohol Use: Yes     Comment: social   . Drug Use: No  . Sexual Activity: Not Asked   Other Topics Concern  . None   Social History Narrative    Allergies  Allergen Reactions  . Other Nausea And Vomiting    ANAESTHESIA MAKES PT VERY SICK ON STOMACH     Current outpatient prescriptions:  Marland Kitchen  Multiple Vitamins-Minerals (MULTIVITAMIN WITH MINERALS) tablet, Take 1 tablet by mouth daily., Disp: , Rfl:  .  Probiotic Product (PROBIOTIC DAILY PO), Take 1 tablet by mouth daily., Disp: , Rfl:  .  cholecalciferol (VITAMIN D) 400 UNITS TABS tablet, Take 400 Units by mouth daily., Disp: , Rfl:  .  Vitamin D, Ergocalciferol, (DRISDOL) 50000 UNITS CAPS capsule, Take 50,000 Units by mouth every 7 (seven) days. Sunday or Monday, Disp: , Rfl:    Review of Systems  Constitutional: Negative for fever, chills, diaphoresis, activity change, appetite change, fatigue and unexpected weight change.  HENT: Negative for congestion, rhinorrhea, sinus pressure, sneezing, sore throat and trouble swallowing.   Eyes: Negative for photophobia and visual disturbance.  Respiratory:  Negative for cough, chest tightness, shortness of breath, wheezing and stridor.   Cardiovascular: Negative for chest pain, palpitations and leg swelling.  Gastrointestinal: Negative for nausea, vomiting, abdominal pain, diarrhea, constipation, blood in stool, abdominal distention and anal bleeding.  Genitourinary: Negative for dysuria, hematuria, flank pain and difficulty urinating.  Musculoskeletal: Positive for arthralgias. Negative for myalgias, back pain, joint swelling and gait problem.  Skin: Positive for wound. Negative for color change, pallor and rash.  Neurological: Negative for dizziness, tremors, weakness and light-headedness.  Hematological: Negative for adenopathy. Does not bruise/bleed easily.  Psychiatric/Behavioral: Negative for behavioral problems, confusion, sleep disturbance, dysphoric mood, decreased concentration and agitation.       Objective:   Physical Exam  Constitutional: He is oriented to person, place, and time. He appears well-developed and well-nourished.  HENT:  Head: Normocephalic and atraumatic.  Eyes: Conjunctivae and EOM are normal.  Neck: Normal range of motion. Neck supple.  Cardiovascular: Normal rate and regular rhythm.   Pulmonary/Chest: Effort normal. No respiratory distress. He has no wheezes.  Abdominal: Soft. He exhibits no distension.  Musculoskeletal: Normal range of motion.  Neurological: He is alert and oriented to person, place, and time.  Skin: Skin is warm and dry. No rash noted. No erythema. No pallor.  Psychiatric: He has a normal mood and affect. His behavior is normal. Judgment and thought content normal.   09/23/14: ankle surgical sites:       01/02/2015:            Assessment & Plan:   #1 Osteomyelitis associated with hardware: will recheck ESR and CRP today and otherwise schedule him to see me in next 1-2 months  Seems odd that he continues to have swelling ofhis ankle that comes and goes but perhaps related to  inflammation not necessarily infection in his foot with greater stress with weight bearing.  #2 Septic emboli to lungs: I reviewed his images and these have resolved  #3 culture negative  Endocarditis vs Septic thrombus on PICC line: Patient never had TEE and he DOES have calcified valves seen on CT chest scan that I reviewed with him.   #4 CKD: creatinine stable: I have asked him to make sure that he NEVER has tobramycin or gentamicin beads placed with future surgeries to treat infection  I spent greater than 25 minutes with the patient including greater than 50% of time in face to face counsel of the patient re his "culture negative endocarditis" hardware associated osteomyelitis of anle, CAD, septic emboli to lungs, CKD,  and in coordination of his  care.

## 2015-01-03 LAB — SEDIMENTATION RATE: SED RATE: 4 mm/h (ref 0–20)

## 2015-01-09 ENCOUNTER — Telehealth: Payer: Self-pay | Admitting: *Deleted

## 2015-01-09 NOTE — Telephone Encounter (Signed)
Patient calling for interpretation of labs from 12/1. Please advise. Connor Frazier, Etai Copado M, RN

## 2015-01-10 NOTE — Telephone Encounter (Signed)
His inflammatory markers were COMPLETELY NORMAL and his kidney function also looks stable. NO lab evidence for infection. But it is not "end all be all"

## 2015-01-10 NOTE — Telephone Encounter (Signed)
Left message with information.

## 2015-01-11 NOTE — Telephone Encounter (Signed)
Very good 

## 2015-07-02 ENCOUNTER — Ambulatory Visit: Payer: BLUE CROSS/BLUE SHIELD | Admitting: Infectious Disease

## 2015-07-24 DIAGNOSIS — E559 Vitamin D deficiency, unspecified: Secondary | ICD-10-CM | POA: Insufficient documentation

## 2015-07-24 DIAGNOSIS — N179 Acute kidney failure, unspecified: Secondary | ICD-10-CM | POA: Insufficient documentation

## 2016-01-03 IMAGING — US US RENAL
1 series · 14 of 25 positions shown · non-contrast
Comparison: None.

CLINICAL DATA: Acute kidney injury.

EXAM:
RENAL / URINARY TRACT ULTRASOUND COMPLETE

[Series 1: us renal · 0.27mm/px · 14 of 33 slices shown]
[im 1/33]
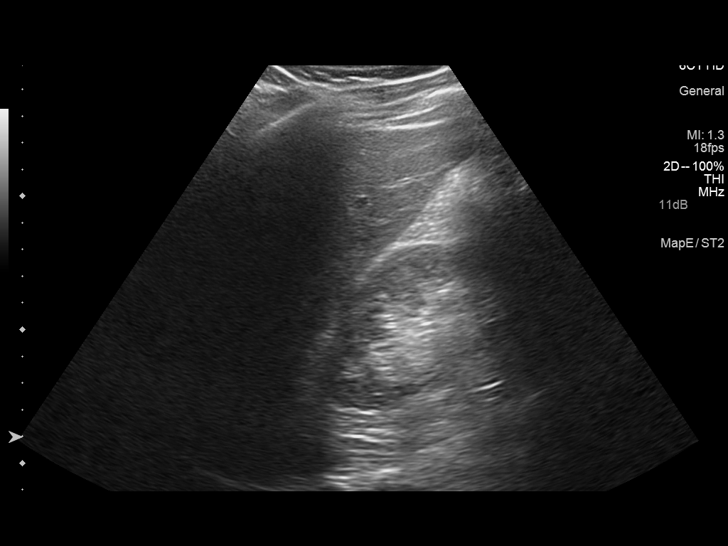
[im 3/33]
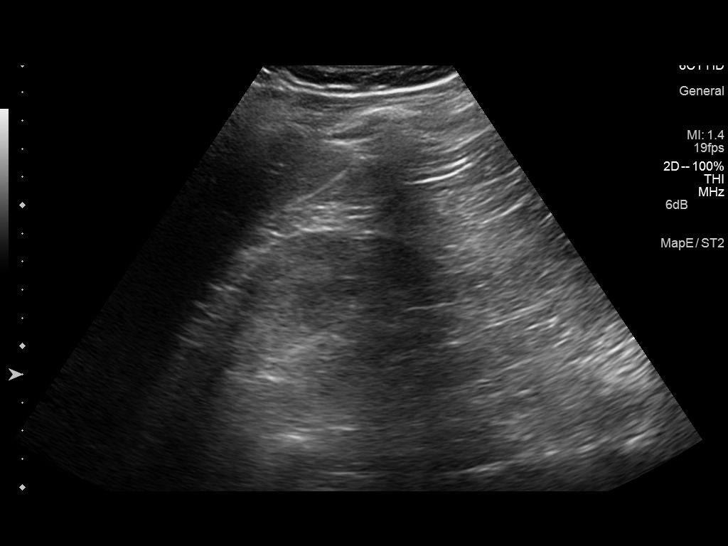
[im 6/33]
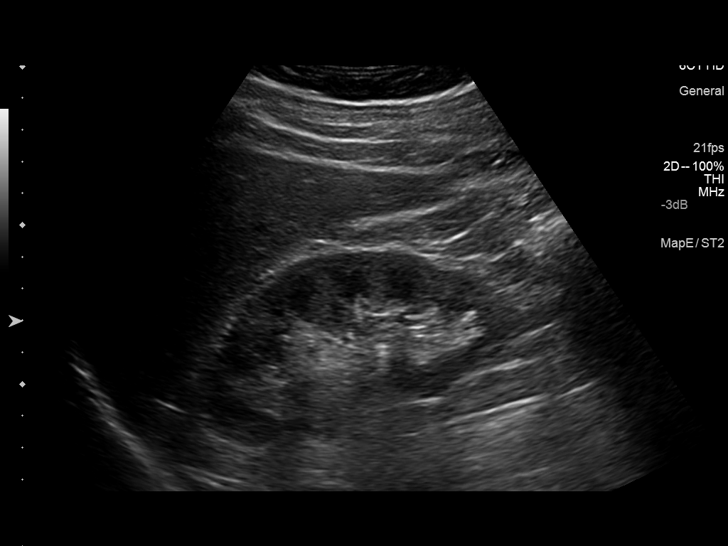
[im 9/33]
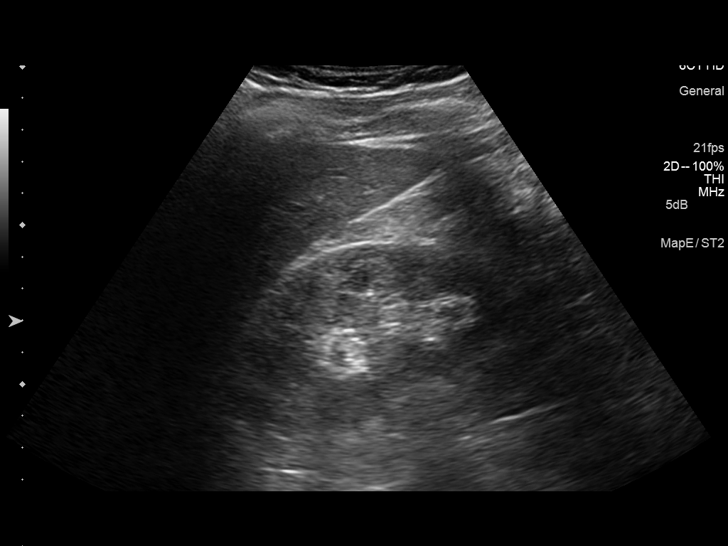
[im 11/33]
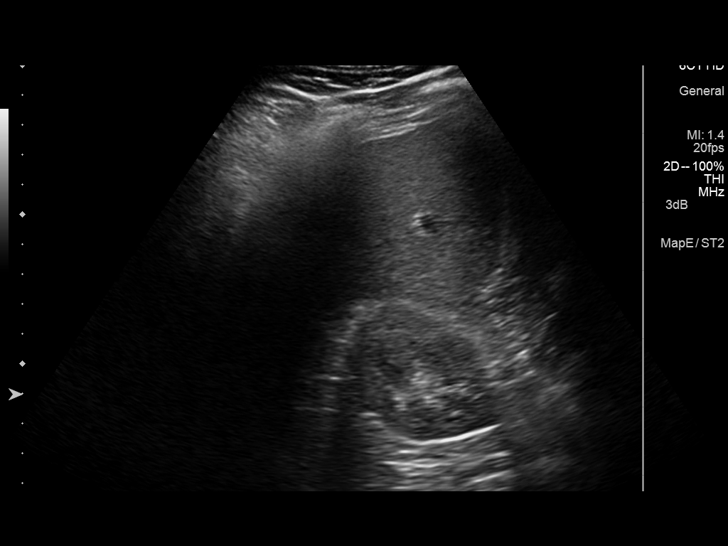
[im 13/33]
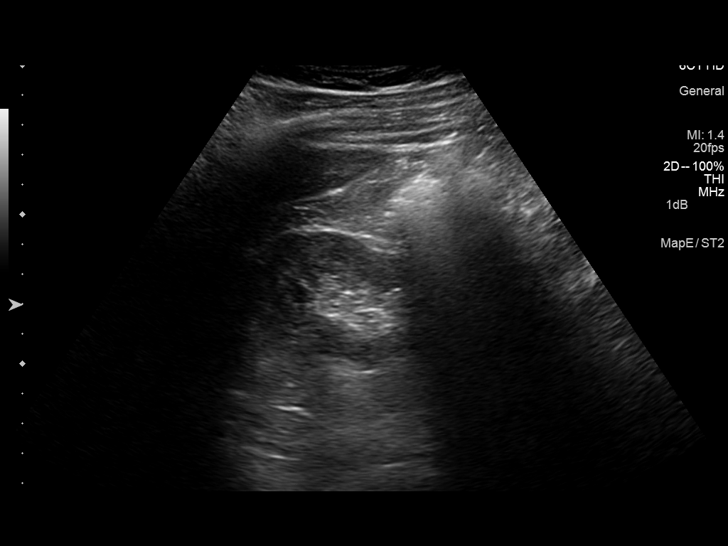
[im 15/33]
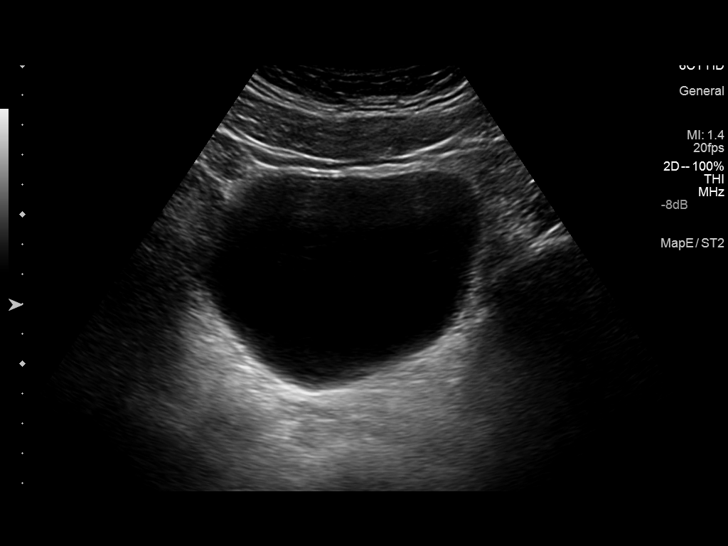
[im 18/33]
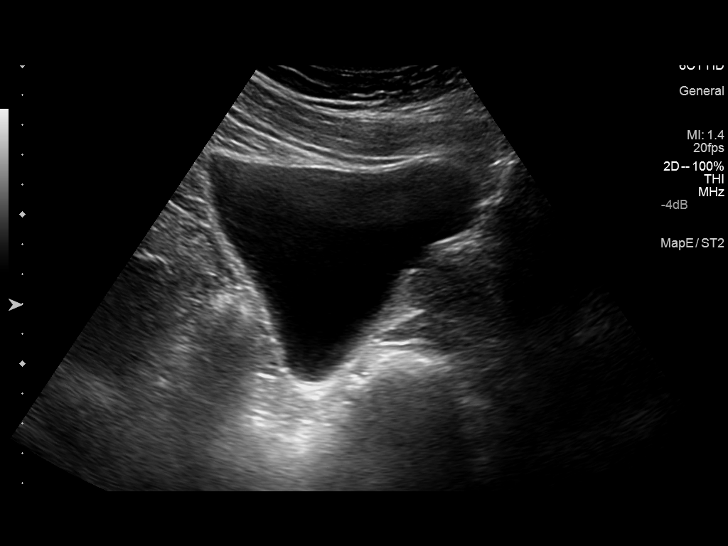
[im 21/33]
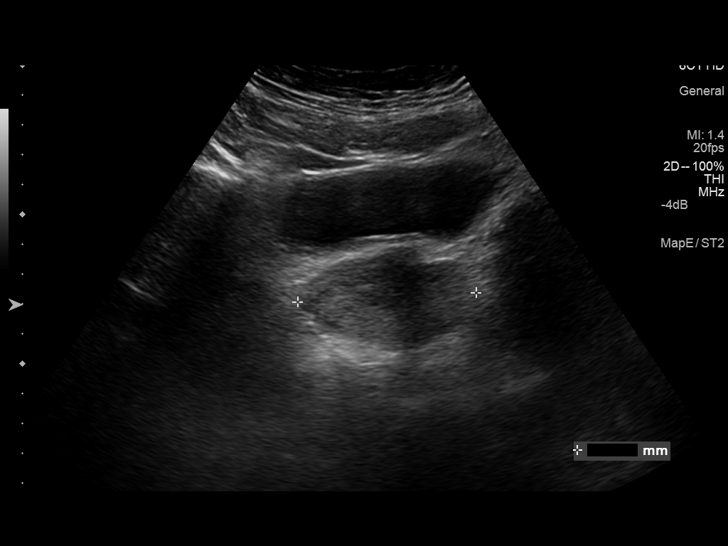
[im 22/33]
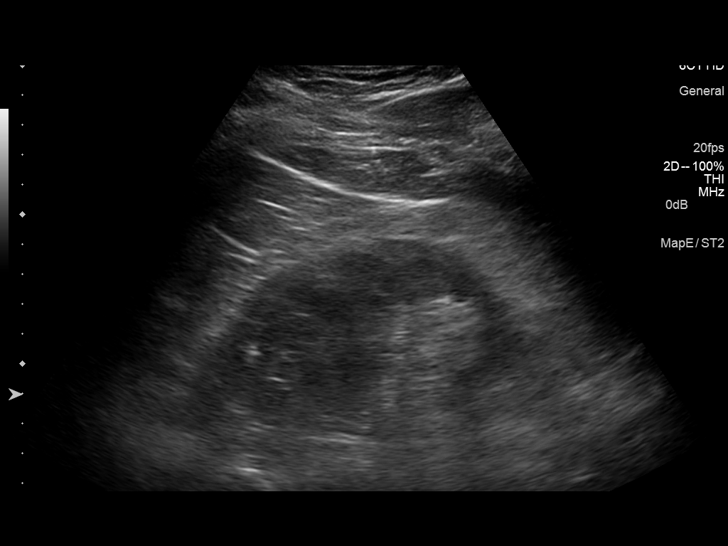
[im 25/33]
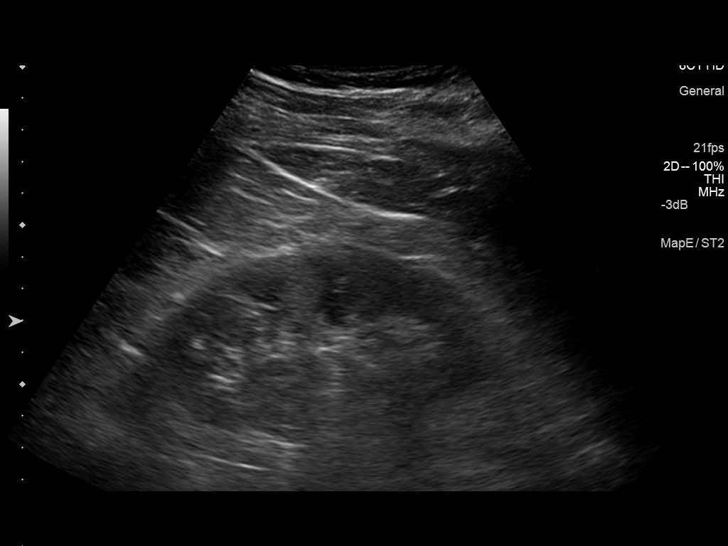
[im 27/33]
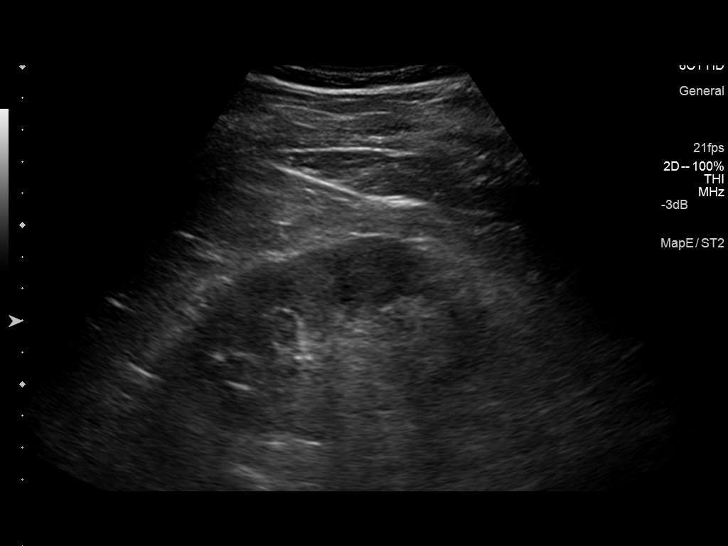
[im 30/33]
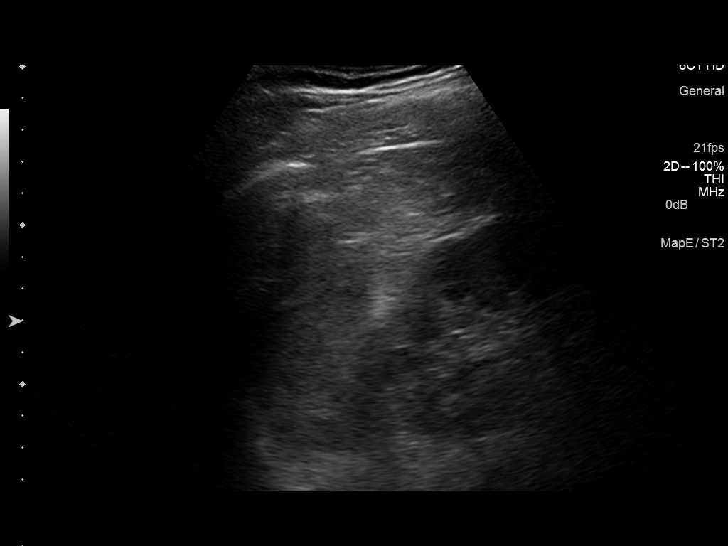
[im 33/33]
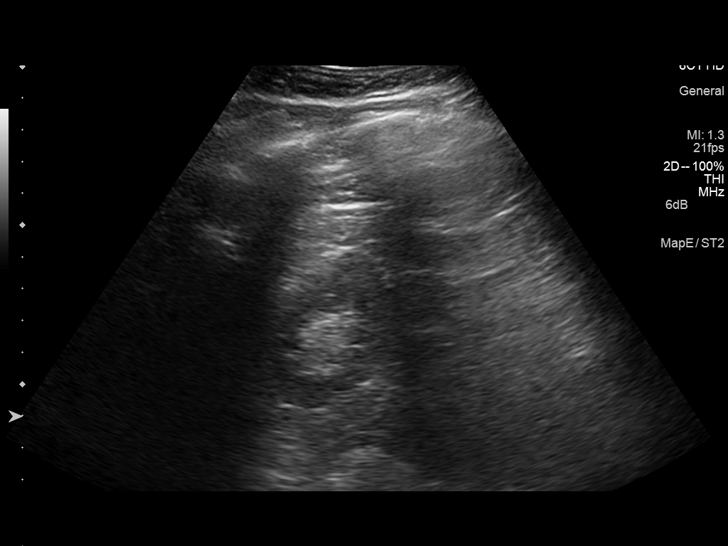

[14 of 25 positions shown; findings below may reference images not displayed]

FINDINGS: Right Kidney:

Length: 10.7 cm. Echogenicity within normal limits. No mass or
hydronephrosis visualized.

Left Kidney:

Length: 11.3 cm. Echogenicity within normal limits. No mass or
hydronephrosis visualized.

Bladder:

Appears normal for degree of bladder distention. Prostate gland
appears mildly enlarged measuring 4.2 x 3.8 x 6.0 cm.
IMPRESSION: 1. Normal appearance of the kidneys and bladder.
2. Prostate gland appears mildly enlarged.

## 2016-01-06 IMAGING — CR DG ANKLE COMPLETE 3+V*R*
3 series · 3 of 3 positions shown · non-contrast
Comparison: 05/21/2014

CLINICAL DATA: Septic arthritis of right ankle. Surgery 1 month
ago.

EXAM:
RIGHT ANKLE - COMPLETE 3+ VIEW

[x ankle ap right]
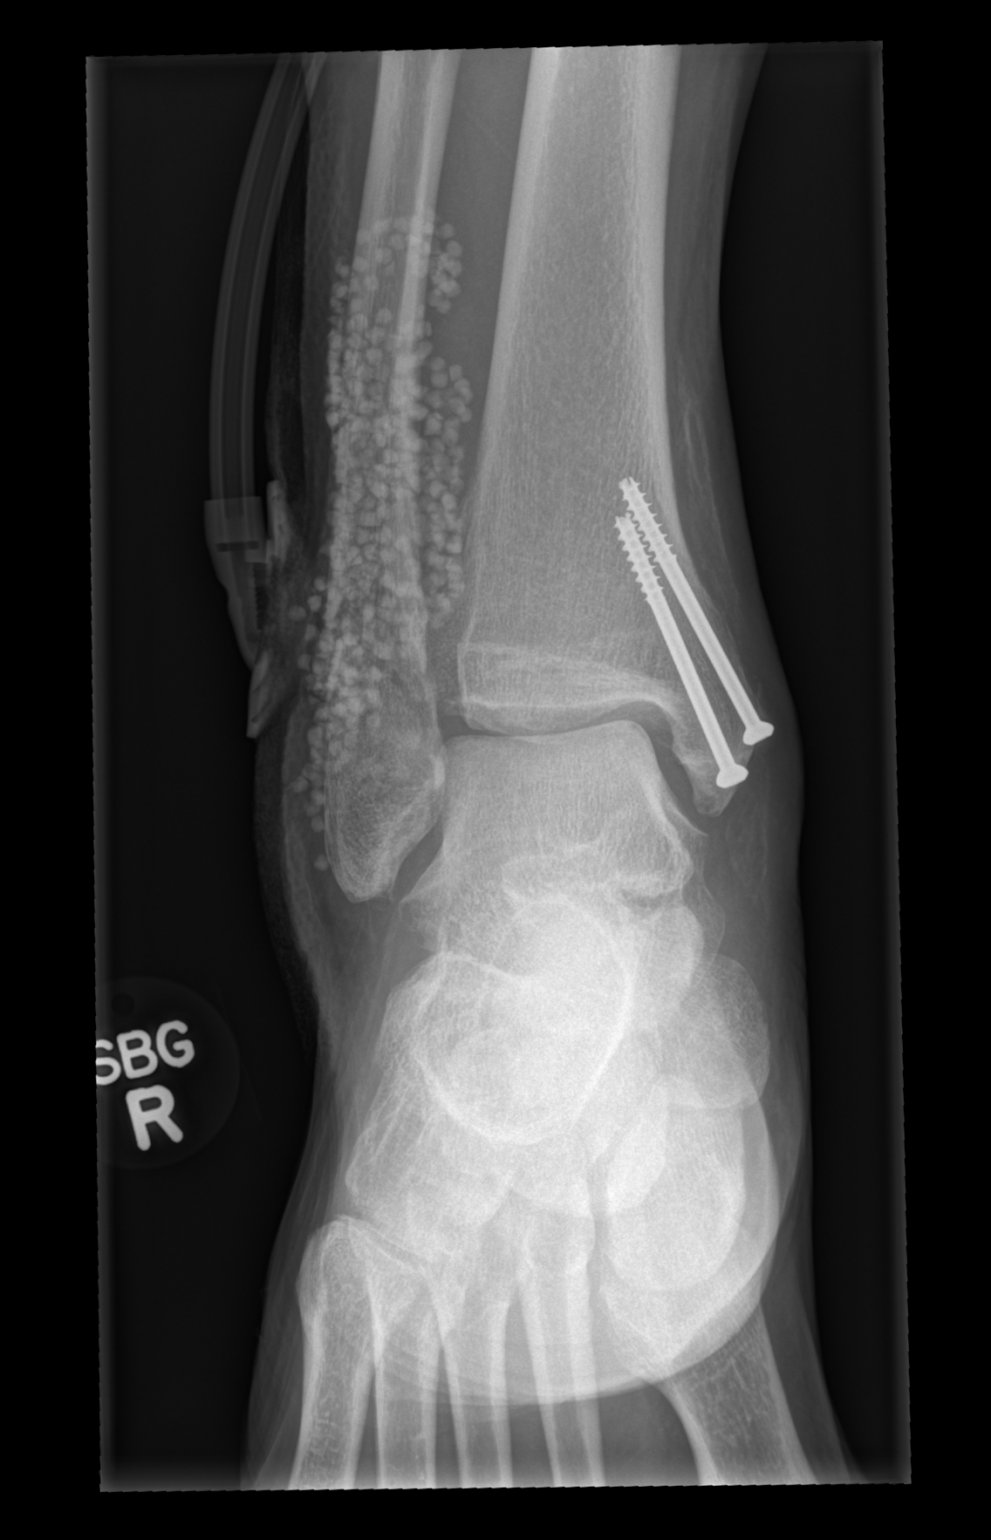

[x ankle obl right]
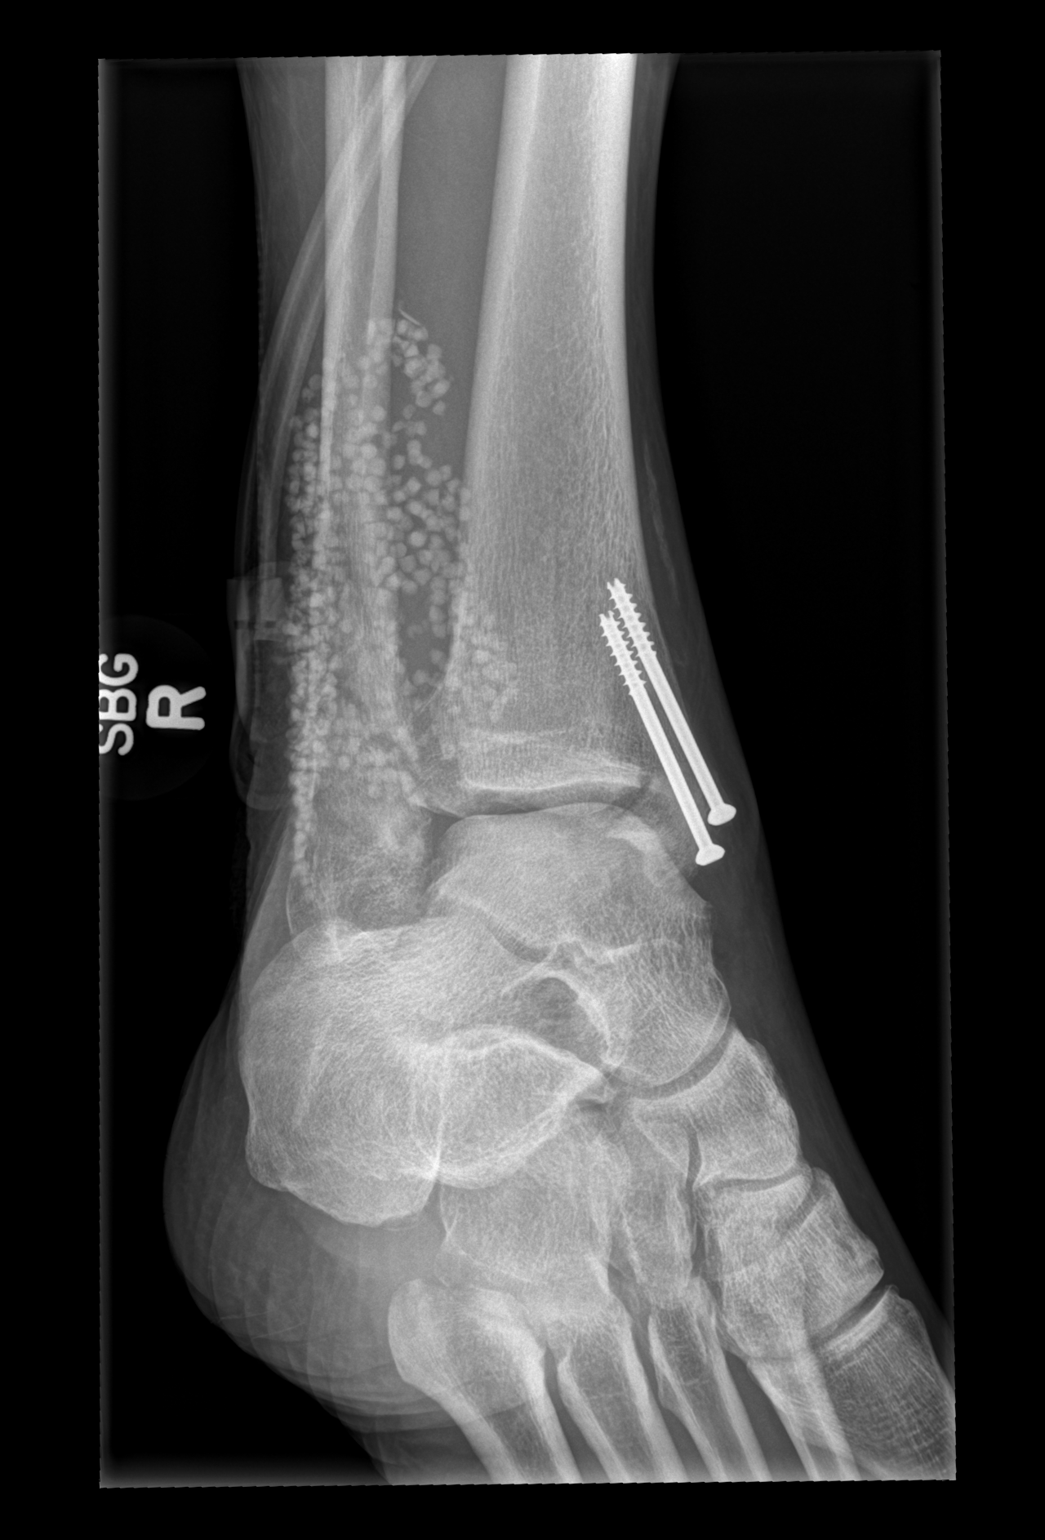

[x ankle lat right]
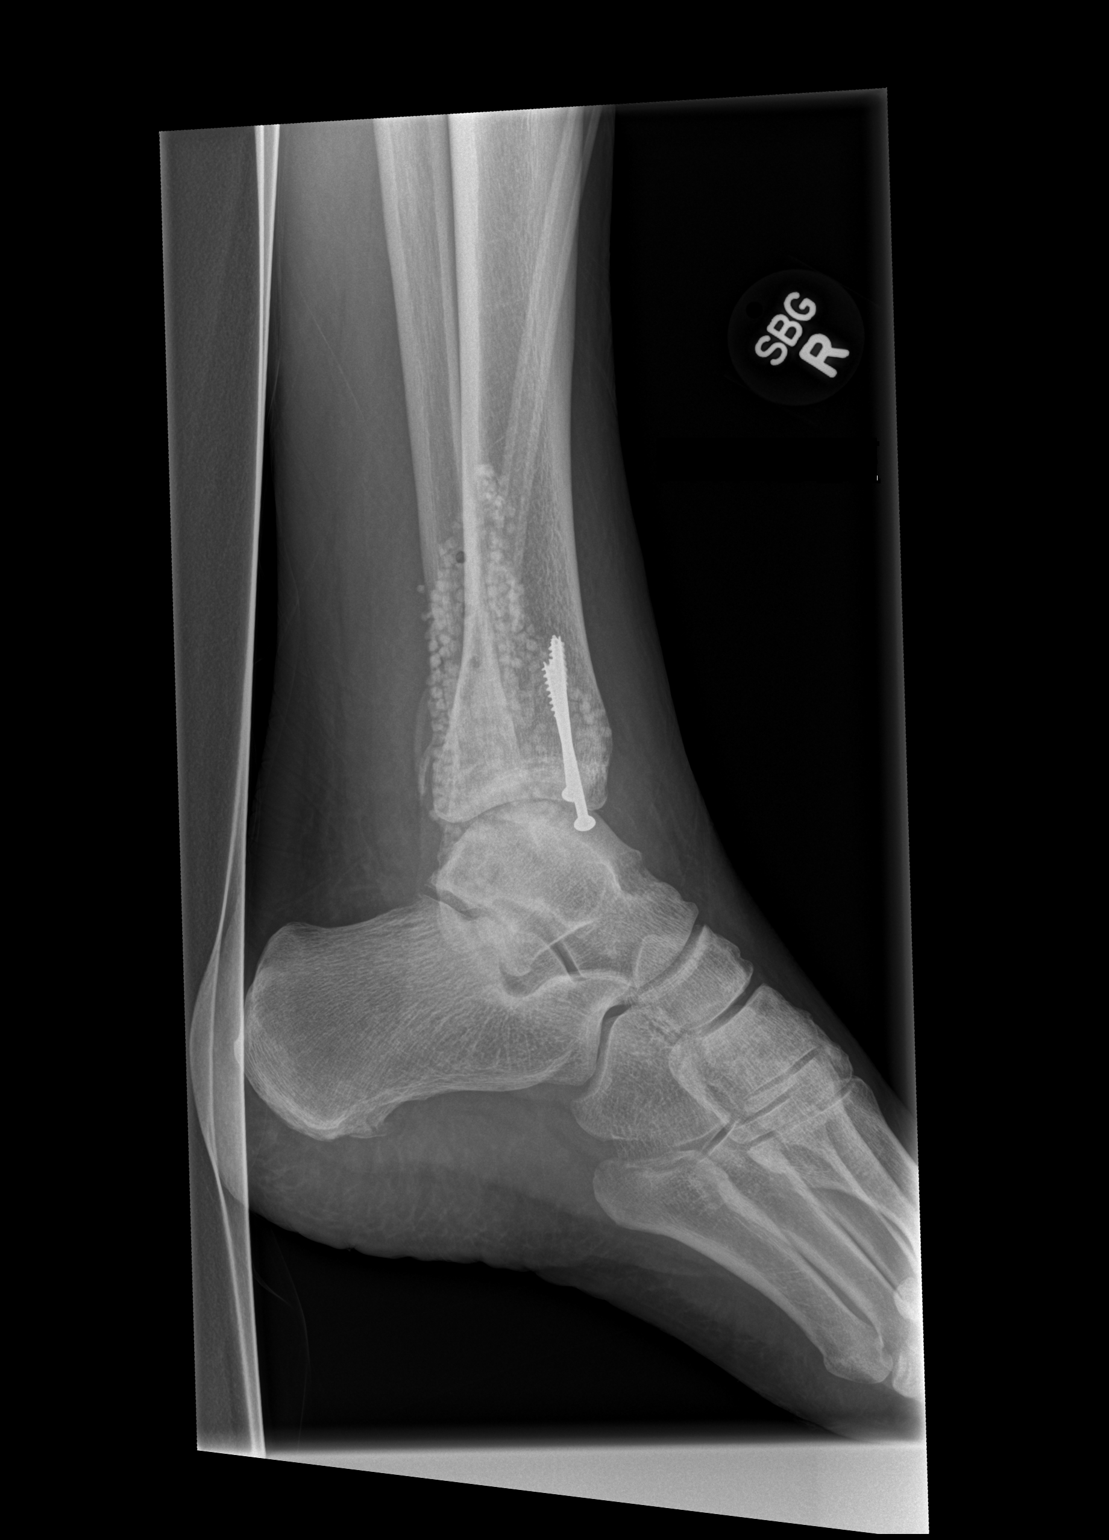

[3 of 3 positions shown; findings below may reference images not displayed]

FINDINGS: Two screws are noted within the distal right tibia across the medial
malleolar fracture. Antibiotic beads project around the distal
fibular shaft and metaphysis. These obscure underlying bone detail,
but suspect continued distal fibular metaphyseal fracture. No
additional acute bony abnormality.
IMPRESSION: Antibiotic beads around the distal fibula with probable underlying
continued distal fibular fracture. Screws across the medial
malleolar fracture.

## 2019-12-18 DIAGNOSIS — U071 COVID-19: Secondary | ICD-10-CM | POA: Diagnosis not present

## 2020-03-11 DIAGNOSIS — Z6828 Body mass index (BMI) 28.0-28.9, adult: Secondary | ICD-10-CM | POA: Diagnosis not present

## 2020-03-11 DIAGNOSIS — R42 Dizziness and giddiness: Secondary | ICD-10-CM | POA: Diagnosis not present

## 2020-08-20 DIAGNOSIS — R42 Dizziness and giddiness: Secondary | ICD-10-CM | POA: Insufficient documentation

## 2020-08-20 DIAGNOSIS — H93A1 Pulsatile tinnitus, right ear: Secondary | ICD-10-CM | POA: Insufficient documentation

## 2020-08-20 DIAGNOSIS — G43009 Migraine without aura, not intractable, without status migrainosus: Secondary | ICD-10-CM | POA: Insufficient documentation

## 2021-04-09 DIAGNOSIS — M722 Plantar fascial fibromatosis: Secondary | ICD-10-CM | POA: Insufficient documentation

## 2021-04-09 DIAGNOSIS — M6702 Short Achilles tendon (acquired), left ankle: Secondary | ICD-10-CM | POA: Insufficient documentation

## 2022-07-01 DIAGNOSIS — Z5320 Procedure and treatment not carried out because of patient's decision for unspecified reasons: Secondary | ICD-10-CM | POA: Insufficient documentation

## 2023-11-21 DIAGNOSIS — Z5112 Encounter for antineoplastic immunotherapy: Secondary | ICD-10-CM | POA: Insufficient documentation

## 2023-11-21 DIAGNOSIS — C155 Malignant neoplasm of lower third of esophagus: Secondary | ICD-10-CM | POA: Insufficient documentation

## 2023-12-21 DIAGNOSIS — R7989 Other specified abnormal findings of blood chemistry: Secondary | ICD-10-CM | POA: Insufficient documentation

## 2023-12-21 DIAGNOSIS — T451X5A Adverse effect of antineoplastic and immunosuppressive drugs, initial encounter: Secondary | ICD-10-CM | POA: Insufficient documentation

## 2023-12-21 DIAGNOSIS — R21 Rash and other nonspecific skin eruption: Secondary | ICD-10-CM | POA: Insufficient documentation

## 2024-01-12 ENCOUNTER — Encounter: Payer: Self-pay | Admitting: *Deleted

## 2024-01-12 NOTE — Progress Notes (Signed)
 Patient is wanting to transfer care to Dr Timmy due to his insurance changing Jan 1, as well as on recommendation from his integrative health MD at [facility]. He wishes for a new appointment early January after his new insurance starts.  Oncology Nurse Navigator Documentation     01/12/2024    2:00 PM  Oncology Nurse Navigator Flowsheets  Navigator Follow Up Date: 02/07/2024  Navigator Follow Up Reason: New Patient Appointment  Navigator Location [facility]  Referral Date to RadOnc/MedOnc 01/12/2024  Navigator Encounter Type Introductory Phone Call  Patient Visit Type MedOnc  Treatment Phase Active Tx  Barriers/Navigation Needs Coordination of Care;Education  Education Other  Interventions Coordination of Care;Education  Acuity Level 2-Minimal Needs (1-2 Barriers Identified)  Coordination of Care Appts  Education Method Verbal;Teach-back  Time Spent with Patient 30

## 2024-01-18 NOTE — Progress Notes (Signed)
 "       Connor Frazier FMW:[id number] DOB:Jun 06, 1955  CONSENT FOR THE ADMINISTRATION OF ANTINEOPLASTIC HAZARDOUS OR BIOTHERAPY FOR ONCOLOGY INDICATIONS  I, Connor Frazier, have been told by my doctor that the following cancer treatment will be given for the diagnosis of esophageal cancer. Dr. Karlynn Font has explained to me that the drug(s) used may have good and bad effects. I have been given a copy of Understanding Cancer Treatment with printed information for the following drugs that includes drug-drug and drug-food interactions, a plan for missed doses and instructions on symptoms and adverse events that require me to contact the healthcare setting or seek immediate attention. I can contact a healthcare provider 24 hours a day by calling [phone].   Recommended Treatment Plan--It has been recommended you receive the following antineoplastic hazardous or biotherapy for treatment for your illness Antineoplastic HD/Biotherapy  Frequency # Expected Cycles  Paclitaxel  Every week With radiation  Carboplatin  Every week With radiation  1. The Physician has offered me or my legal representative the opportunity to ask questions and to have those questions answered before obtaining this Consent for Administration for antineoplastic HD and/or biotherapy. I understand the nature of the treatment. I have been instructed/informed of the following:  The risks, complications, and expected benefits or effects of the treatment or procedure including planned supportive care medications.  Any alternatives to the treatment plan or procedure, including no treatment, and risks/benefits of alternatives. The right to refuse treatment or procedure will in no way jeopardize my access to healthcare services.  Procedures for handling medications in the home and for handling body secretions and waste.  2. I understand that my doctors cannot be sure the treatment will help me.  3. I understand the  antineoplastic HD and/or biotherapy medications and supportive medications recommended by my doctor can have short and long-term effects. My doctor talked to me about the most important side effects that may include: Low Blood Counts  Heart Effects Hair Loss Reproductive/Fertility Effects  Risk of Infection/Bleeding Lung Effects Allergic Type Reactions Sexual Effects  Fatigue Kidney/Bladder Effects Hearing Loss  Secondary Cancers  Sores in Mouth or Throat Pancreas/Liver/Intestine Effects Thyroid/Adrenal Effects Skin Effects  Nausea/Vomiting Constipation/Diarrhea  Muscle/Bone/Nerve Effects    4. I understand that side effects of antineoplastic HD and/or biotherapy may be serious at times resulting in life-threatening side effects, hospitalization, or death. There may be side effects from my chemotherapy and/or biotherapy that are not listed on this form. Each patient can respond differently and could have side effects that have not been reported by others. 5. For women of childbearing potential: I understand abstinence or 2 forms of reliable contraception must be used during therapy and 1 month following discontinuation of treatment. Male patients should use a condom with women of child bearing potential during therapy and 1 month following discontinuation of treatment.  6. I understand that if I am experiencing symptoms or adverse events related to oral or self-administered treatment or if I need to cancel or reschedule an appointment that I should contact my healthcare provider for advice on continuing or discontinuing my treatment or to make a new appointment. 7. I understand that by signing this document I agree to undergo antineoplastic HD and/or biotherapy, which may include FDA-approved biosimilar or generic products. 8. I understand the Goal of Treatment at this time is to control the progression of my cancer or relieve symptoms and improve quality of  life.   ____________________________________________________________________ Patient  or Financial Risk Analyst  Date/Time   ____________________________________________________________________ Witness Signature      Date/Time   ____________________________________________________________________ Verified By Signature     Date/Time   ____________________________________________________________________ Interpreter Signature     Date/Time  Refer to Media Tab in [place] to view a signed copy of this document.            "

## 2024-02-06 ENCOUNTER — Inpatient Hospital Stay: Admitting: Hematology & Oncology

## 2024-02-06 ENCOUNTER — Inpatient Hospital Stay

## 2024-02-07 ENCOUNTER — Inpatient Hospital Stay: Attending: Hematology & Oncology

## 2024-02-07 ENCOUNTER — Other Ambulatory Visit: Payer: Self-pay

## 2024-02-07 ENCOUNTER — Encounter: Payer: Self-pay | Admitting: *Deleted

## 2024-02-07 ENCOUNTER — Inpatient Hospital Stay (HOSPITAL_BASED_OUTPATIENT_CLINIC_OR_DEPARTMENT_OTHER): Admitting: Hematology & Oncology

## 2024-02-07 ENCOUNTER — Encounter: Payer: Self-pay | Admitting: Hematology & Oncology

## 2024-02-07 ENCOUNTER — Ambulatory Visit: Payer: Self-pay | Admitting: Hematology & Oncology

## 2024-02-07 ENCOUNTER — Ambulatory Visit (HOSPITAL_BASED_OUTPATIENT_CLINIC_OR_DEPARTMENT_OTHER)
Admission: RE | Admit: 2024-02-07 | Discharge: 2024-02-07 | Disposition: A | Discharge status: Home / Self Care | Source: Ambulatory Visit | Attending: Hematology & Oncology | Admitting: Hematology & Oncology

## 2024-02-07 VITALS — BP 131/61 | HR 65 | Temp 98.3°F | Resp 19 | Ht 70.5 in | Wt 179.0 lb

## 2024-02-07 DIAGNOSIS — Z79899 Other long term (current) drug therapy: Secondary | ICD-10-CM | POA: Insufficient documentation

## 2024-02-07 DIAGNOSIS — C159 Malignant neoplasm of esophagus, unspecified: Secondary | ICD-10-CM | POA: Insufficient documentation

## 2024-02-07 DIAGNOSIS — C160 Malignant neoplasm of cardia: Secondary | ICD-10-CM | POA: Insufficient documentation

## 2024-02-07 DIAGNOSIS — D500 Iron deficiency anemia secondary to blood loss (chronic): Secondary | ICD-10-CM | POA: Diagnosis not present

## 2024-02-07 DIAGNOSIS — D509 Iron deficiency anemia, unspecified: Secondary | ICD-10-CM | POA: Diagnosis not present

## 2024-02-07 LAB — IRON AND IRON BINDING CAPACITY (CC-WL,HP ONLY)
Iron: 25 ug/dL — ABNORMAL LOW (ref 45–182)
Saturation Ratios: 7 % — ABNORMAL LOW (ref 17.9–39.5)
TIBC: 354 ug/dL (ref 250–450)
UIBC: 329 ug/dL

## 2024-02-07 LAB — LACTATE DEHYDROGENASE: LDH: 231 U/L (ref 105–235)

## 2024-02-07 LAB — CMP (CANCER CENTER ONLY)
ALT: 29 U/L (ref 0–44)
AST: 20 U/L (ref 15–41)
Albumin: 3.9 g/dL (ref 3.5–5.0)
Alkaline Phosphatase: 46 U/L (ref 38–126)
Anion gap: 14 (ref 5–15)
BUN: 24 mg/dL — ABNORMAL HIGH (ref 8–23)
CO2: 24 mmol/L (ref 22–32)
Calcium: 9.8 mg/dL (ref 8.9–10.3)
Chloride: 102 mmol/L (ref 98–111)
Creatinine: 1.72 mg/dL — ABNORMAL HIGH (ref 0.61–1.24)
GFR, Estimated: 43 mL/min — ABNORMAL LOW
Glucose, Bld: 92 mg/dL (ref 70–99)
Potassium: 4.4 mmol/L (ref 3.5–5.1)
Sodium: 139 mmol/L (ref 135–145)
Total Bilirubin: 0.6 mg/dL (ref 0.0–1.2)
Total Protein: 7.3 g/dL (ref 6.5–8.1)

## 2024-02-07 LAB — CBC WITH DIFFERENTIAL (CANCER CENTER ONLY)
Abs Immature Granulocytes: 0.1 K/uL — ABNORMAL HIGH (ref 0.00–0.07)
Basophils Absolute: 0 K/uL (ref 0.0–0.1)
Basophils Relative: 0 %
Eosinophils Absolute: 0.3 K/uL (ref 0.0–0.5)
Eosinophils Relative: 2 %
HCT: 42.4 % (ref 39.0–52.0)
Hemoglobin: 13.4 g/dL (ref 13.0–17.0)
Immature Granulocytes: 1 %
Lymphocytes Relative: 5 %
Lymphs Abs: 0.6 K/uL — ABNORMAL LOW (ref 0.7–4.0)
MCH: 27.3 pg (ref 26.0–34.0)
MCHC: 31.6 g/dL (ref 30.0–36.0)
MCV: 86.4 fL (ref 80.0–100.0)
Monocytes Absolute: 0.8 K/uL (ref 0.1–1.0)
Monocytes Relative: 7 %
Neutro Abs: 9.9 K/uL — ABNORMAL HIGH (ref 1.7–7.7)
Neutrophils Relative %: 85 %
Platelet Count: 195 K/uL (ref 150–400)
RBC: 4.91 MIL/uL (ref 4.22–5.81)
RDW: 15.6 % — ABNORMAL HIGH (ref 11.5–15.5)
WBC Count: 11.7 K/uL — ABNORMAL HIGH (ref 4.0–10.5)
nRBC: 0 % (ref 0.0–0.2)

## 2024-02-07 LAB — FERRITIN: Ferritin: 214 ng/mL (ref 24–336)

## 2024-02-07 NOTE — Progress Notes (Signed)
 Initial RN Navigator Patient Visit  Name: Connor Frazier Date of Referral : 01/12/2024 Diagnosis: Esophageal Cancer  Met with patient prior to their visit with MD. Twyla patient Your Patient Navigator handout which explains my role, areas in which I am able to help, and all the contact information for myself and the office. Also gave patient MD and Navigator business card. Reviewed with patient the general overview of expected course after initial diagnosis and time frame for all steps to be completed.  Patient is transferring care to this office due to insurance changes.   Patient completed visit with Dr. Timmy. He would like a repeat endoscopy. This is scheduled for 02/09/2024. Will need follow up once this is complete.  Patient understands all follow up procedures and expectations. They have my number to reach out for any further clarification or additional needs.   Oncology Nurse Navigator Documentation     02/07/2024   11:00 AM  Oncology Nurse Navigator Flowsheets  Navigator Follow Up Date: 02/09/2024  Navigator Follow Up Reason: Other:  Navigator Location [facility]  Navigator Encounter Type Appt/Treatment Plan Review  Patient Visit Type MedOnc  Treatment Phase Active Tx  Barriers/Navigation Needs Coordination of Care;Education  Interventions None Required  Acuity Level 2-Minimal Needs (1-2 Barriers Identified)  Time Spent with Patient 15

## 2024-02-07 NOTE — Progress Notes (Signed)
 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  HPI: Connor Frazier is a very nice 69 year old white male.  He is originally from [place] .  He comes in with his wife.  He was a chartered certified accountant by trade.  He initially I think presented back in October 2025.  He also had difficulties swallowing.  He had a little bit of vomiting.  He subsequently had an upper endoscopy.  This showed a large friable 7-8 cm mass extending from the GE junction into the gastric cardia.  Biopsies were taken.  It was found to be an adenocarcinoma.  Surprisingly, the tumor was microsatellite and stable and MMR deficient.  Additional molecular studies were done.  The tumor was HER2 unmutated.  The tumor also was BRAF wild-type.  Staging studies did not show any evidence of metastatic disease.  I think he had a PET scan or possibly MRI.  He was seen at [facility].  They felt that he would be a candidate for immunotherapy.  They did not feel that surgery would be needed at the time.  He was treated with Yervoy/Opdivo.  Unfortunately, he had horrible toxicity to this.  He had severe hepatitis.  He has had chills.  He has had significant fatigue.  He has been placed on steroids.  He is on a steroid taper right now.  He has gone to [facility].  He has been on complementary type of therapy.  Surprisingly, he did have a follow-up PET scan.  Not surprisingly, he had a very nice response.  He has significantly decreased bulk and extent of the mass at the GE junction.  There is no focal activity in the left subpectoral region.  There is nothing that looks like any adenopathy.  He was referred to the [facility] to see what else might be done.  He is on a steroid taper right now.  Is on 10 mg of prednisone.  He is doing okay with this.  He is having these  episodes of chills.  I am not sure exactly what that means.  He is also  having a dry cough.  We will get a chest x-ray on him.  He seems to be swallowing pretty well.  He has had no issues with headache.  He has had no rashes right now.  I think he has had a little bit of diarrhea.  His LFTs have slowly normalized and there numbers.  Of note, he did have a Signatera test that was done on 12/01/2023.  This did show positive which I am not surprised since this was I think before he had any treatment.  He said that he is going to have another Signatera test I think tomorrow.  Currently, I would have to say that his performance status is probably ECOG 1.   Past Medical History:  Diagnosis Date   Bacterial endocarditis 09/23/2014   CAD in native artery 09/23/2014   Chronic kidney disease    stage III- sees neph at [facility]   Elevated serum creatinine    GERD (gastroesophageal reflux disease)    takes probiotics   H/O cytopenia    Medication induced   Hypercholesterolemia    Lung infection 2016   NAFLD (nonalcoholic fatty liver disease) 1/77/7983   PONV (postoperative nausea and vomiting)  Septic embolism (HCC)    Valvular heart disease 09/23/2014  :   Past Surgical History:  Procedure Laterality Date   APPENDECTOMY     COLONOSCOPY     DENTAL SURGERY     implants    HARDWARE REMOVAL Right 06/21/2014   Procedure: Removal Hardware Right Ankle, Place Antibiotic Beads, and Wound VAC;  Surgeon: Jerona Harden GAILS, MD;  Location: [facility] OR;  Service: Orthopedics;  Laterality: Right;   HARDWARE REMOVAL Right 08/28/2014   Procedure: Removal Deep Hardware Right Ankle;  Surgeon: Jerona Harden GAILS, MD;  Location: [facility] OR;  Service: Orthopedics;  Laterality: Right;   I & D EXTREMITY Right 05/29/2014   Procedure: IRRIGATION AND DEBRIDEMENT RIGHT ANKLE AND PLACE ANTIBIOTIC BEADS;  Surgeon: Jerona Harden GAILS, MD;  Location: [facility] OR;  Service: Orthopedics;  Laterality: Right;   IRRIGATION AND  DEBRIDEMENT ABSCESS Right 05/29/2014   rt ankle    ORIF ANKLE FRACTURE Right 05/24/2014   Procedure: OPEN REDUCTION INTERNAL FIXATION (ORIF) ANKLE FRACTURE;  Surgeon: Jerona Harden GAILS, MD;  Location: [facility] OR;  Service: Orthopedics;  Laterality: Right;   PERIPHERALLY INSERTED CENTRAL CATHETER INSERTION     PERIPHERALLY INSERTED CENTRAL CATHETER INSERTION     PICC LINE REMOVAL ([facility] HX)    :  Current Medications[1]:  :  Allergies[2]:   Family History  Problem Relation Age of Onset   Dementia Mother    Diabetes Father    Heart disease Father    Cancer - Prostate Father   :   Social History   Socioeconomic History   Marital status: Married    Spouse name: Not on file   Number of children: Not on file   Years of education: Not on file   Highest education level: Not on file  Occupational History   Not on file  Tobacco Use   Smoking status: Never   Smokeless tobacco: Never  Substance and Sexual Activity   Alcohol use: Yes    Comment: social    Drug use: No   Sexual activity: Not on file  Other Topics Concern   Not on file  Social History Narrative   Not on file   Social Drivers of Health   Tobacco Use: Low Risk (02/07/2024)   Patient History    Smoking Tobacco Use: Never    Smokeless Tobacco Use: Never    Passive Exposure: Not on file  Financial Resource Strain: Not on file  Food Insecurity: No Food Insecurity (02/07/2024)   Epic    Worried About Programme Researcher, Broadcasting/film/video in the Last Year: Never true    Ran Out of Food in the Last Year: Never true  Transportation Needs: No Transportation Needs (02/07/2024)   Epic    Lack of Transportation (Medical): No    Lack of Transportation (Non-Medical): No  Physical Activity: Not on file  Stress: Not on file  Social Connections: Not on file  Intimate Partner Violence: Not At Risk (02/07/2024)   Epic    Fear of Current or Ex-Partner: No    Emotionally Abused: No    Physically Abused: No    Sexually Abused: No  Depression (PHQ2-9):  Low Risk (02/07/2024)   Depression (PHQ2-9)    PHQ-2 Score: 0  Alcohol Screen: Not on file  Housing: Low Risk (02/07/2024)   Epic    Unable to Pay for Housing in the Last Year: No    Number of Times Moved in the Last Year: 0    Homeless in the Last  Year: No  Utilities: Not At Risk (02/07/2024)   Epic    Threatened with loss of utilities: No  Health Literacy: Not on file  :  Review of Systems  Constitutional:  Positive for malaise/fatigue.  HENT: Negative.    Eyes: Negative.   Respiratory: Negative.    Cardiovascular: Negative.   Gastrointestinal: Negative.   Genitourinary: Negative.   Musculoskeletal: Negative.   Skin: Negative.   Neurological: Negative.   Endo/Heme/Allergies: Negative.   Psychiatric/Behavioral: Negative.     .  Exam:  Vital signs are temperature of 98.3.  Pulse 65.  Blood pressure 131/61.  Weight is 179 pounds.  @IPVITALS @ Physical Exam Vitals reviewed.  HENT:     Head: Normocephalic and atraumatic.  Eyes:     Pupils: Pupils are equal, round, and reactive to light.  Cardiovascular:     Rate and Rhythm: Normal rate and regular rhythm.     Heart sounds: Normal heart sounds.  Pulmonary:     Effort: Pulmonary effort is normal.     Breath sounds: Normal breath sounds.  Abdominal:     General: Bowel sounds are normal.     Palpations: Abdomen is soft.  Musculoskeletal:        General: No tenderness or deformity. Normal range of motion.     Cervical back: Normal range of motion.  Lymphadenopathy:     Cervical: No cervical adenopathy.  Skin:    General: Skin is warm and dry.     Findings: No erythema or rash.  Neurological:     Mental Status: He is alert and oriented to person, place, and time.  Psychiatric:        Behavior: Behavior normal.        Thought Content: Thought content normal.        Judgment: Judgment normal.     Recent Labs    02/07/24 1109  WBC 11.7*  HGB 13.4  HCT 42.4  PLT 195    Recent Labs    02/07/24 1109  NA 139   K 4.4  CL 102  CO2 24  GLUCOSE 92  BUN 24*  CREATININE 1.72*  CALCIUM 9.8    Blood smear review: None  Pathology: See above    Assessment and Plan: Connor Frazier is a very nice 69 year old white male.  He has a history of localized or maybe locally advanced adenocarcinoma of the GE junction.  I think what is critical is that his tumor was MSI high and MMR deficient.  He did have combined immunotherapy with Yervoy/Opdivo.  He obviously has had a very bad time with this.  Again his LFTs have certainly normalized.  I still think this is a situation where we really need to see about curing this.  I really would hate to discount further immunotherapy.  I think the problem that he had was with Yervoy.  I would like to get another upper endoscopy on him.  I think this would certainly be quite helpful.  We will have to refer him back to his Gastroenterologist.  It is possible that we might be able to utilize immunotherapy with dostarlimab.  I think this would certainly be reasonable.  Hopefully, he would be able to tolerate this.  He is still on steroids.  However he is only on 10 mg of prednisone.  I would keep him on this for right now.  He still in good shape.  Again, I think our goal here is still needs to be cure.  If  this requires surgery I think he would still be amenable to surgery.  He just does not amenable to chemotherapy.  He is worried about the side effects of chemotherapy and radiation therapy.  We will plan to see him back probably after he has his upper endoscopy.  We will see about getting in touch with Dr. Murriel.         [1]  Current Outpatient Medications:    chlorhexidine  (PERIDEX ) 0.12 % solution, SMARTSIG:0.5 Capful(s) Every Night, Disp: , Rfl:    NP THYROID 60 MG tablet, Take 60 mg by mouth every morning., Disp: , Rfl:    predniSONE (DELTASONE) 10 MG tablet, Take 10 mg by mouth daily with breakfast., Disp: , Rfl:    omeprazole (PRILOSEC) 20 MG  capsule, Take 20 mg by mouth daily., Disp: , Rfl:    Probiotic Product (PROBIOTIC DAILY PO), Take 1 tablet by mouth daily., Disp: , Rfl:  [2]  Allergies Allergen Reactions   Other Nausea And Vomiting    ANAESTHESIA MAKES PT VERY SICK ON STOMACH   Penicillins Nausea And Vomiting, Other (See Comments) and Nausea Only    Reaction only if taken po, can tolerate IV  Upset stomach  Reaction only if taken po, can tolerate IV  Reaction only if taken po, can tolerate IV

## 2024-02-07 NOTE — Addendum Note (Signed)
 Addended by: TIMMY COY R on: 02/07/2024 07:24 PM   Modules accepted: Orders

## 2024-02-08 ENCOUNTER — Encounter: Payer: Self-pay | Admitting: Hematology & Oncology

## 2024-02-08 ENCOUNTER — Other Ambulatory Visit: Payer: Self-pay | Admitting: Family

## 2024-02-08 NOTE — Telephone Encounter (Signed)
-----   Message from Maude Crease, MD sent at 02/07/2024  7:19 PM EST ----- Call - the iron is very low.  This may explain the symptoms that he has.  We need to get him in for IV iron.  Please set this up.   Jeralyn

## 2024-02-08 NOTE — Telephone Encounter (Signed)
 Advised via MyChart.

## 2024-02-09 ENCOUNTER — Telehealth: Payer: Self-pay | Admitting: Hematology & Oncology

## 2024-02-09 NOTE — Telephone Encounter (Signed)
 Called to schedule infuson per inbasket. LVM to return call for scheduling.

## 2024-02-10 ENCOUNTER — Encounter: Payer: Self-pay | Admitting: Hematology & Oncology

## 2024-02-10 ENCOUNTER — Encounter: Payer: Self-pay | Admitting: *Deleted

## 2024-02-10 NOTE — Progress Notes (Signed)
 Patient had his endoscopy yesterday. Findings as follows:  Again seen , though smaller then previously , mass at the GEJ extending into the cardia about 5cm in size. Several biopsies done. The gastric mucosa appeared normal. The first and second part of the duodenum appeared normal.   Will follow for path results.   Oncology Nurse Navigator Documentation     02/10/2024    2:00 PM  Oncology Nurse Navigator Flowsheets  Navigator Follow Up Date: 02/14/2024  Navigator Follow Up Reason: Pathology  Navigator Location [facility]  Navigator Encounter Type Appt/Treatment Plan Review  Patient Visit Type MedOnc  Treatment Phase Active Tx  Barriers/Navigation Needs Coordination of Care;Education  Interventions None Required  Acuity Level 2-Minimal Needs (1-2 Barriers Identified)  Time Spent with Patient 15

## 2024-02-15 ENCOUNTER — Encounter: Payer: Self-pay | Admitting: *Deleted

## 2024-02-15 ENCOUNTER — Encounter: Payer: Self-pay | Admitting: Hematology & Oncology

## 2024-02-15 NOTE — Progress Notes (Signed)
 Per Dr Timmy, request for Foundation One testing sent on specimen [id number] DOS 02/09/2024   Dr Timmy would like for patient to be seen to discuss treatment options. Message sent to scheduling.   Oncology Nurse Navigator Documentation     02/15/2024    9:00 AM  Oncology Nurse Navigator Flowsheets  Abnormal Finding Date 10/25/2023  Confirmed Diagnosis Date 10/25/2023  Phase of Treatment Chemotherapy  Chemotherapy Actual Start Date: 12/05/2023  Navigator Follow Up Date: 02/16/2024  Navigator Follow Up Reason: Follow-up Appointment  Navigator Location [facility]  Navigator Encounter Type Molecular Studies;Pathology Review  Patient Visit Type MedOnc  Treatment Phase Active Tx  Barriers/Navigation Needs Coordination of Care;Education  Interventions Coordination of Care  Acuity Level 2-Minimal Needs (1-2 Barriers Identified)  Coordination of Care Appts;Pathology  Time Spent with Patient 30  Genetic Counseling Type None

## 2024-02-16 ENCOUNTER — Encounter: Payer: Self-pay | Admitting: *Deleted

## 2024-02-16 ENCOUNTER — Inpatient Hospital Stay: Admitting: Hematology & Oncology

## 2024-02-16 ENCOUNTER — Inpatient Hospital Stay

## 2024-02-16 ENCOUNTER — Encounter: Payer: Self-pay | Admitting: Hematology & Oncology

## 2024-02-16 ENCOUNTER — Other Ambulatory Visit: Payer: Self-pay

## 2024-02-16 VITALS — Ht 70.5 in | Wt 177.0 lb

## 2024-02-16 VITALS — BP 138/68 | HR 54 | Temp 97.7°F | Resp 18

## 2024-02-16 DIAGNOSIS — E039 Hypothyroidism, unspecified: Secondary | ICD-10-CM

## 2024-02-16 DIAGNOSIS — C160 Malignant neoplasm of cardia: Secondary | ICD-10-CM | POA: Diagnosis not present

## 2024-02-16 DIAGNOSIS — C155 Malignant neoplasm of lower third of esophagus: Secondary | ICD-10-CM

## 2024-02-16 DIAGNOSIS — D500 Iron deficiency anemia secondary to blood loss (chronic): Secondary | ICD-10-CM

## 2024-02-16 MED ORDER — LORATADINE 10 MG PO TABS
10.0000 mg | ORAL_TABLET | Freq: Once | ORAL | Status: AC
  Administered 2024-02-16: 10 mg via ORAL
  Filled 2024-02-16: qty 1

## 2024-02-16 MED ORDER — ACETAMINOPHEN 325 MG PO TABS
650.0000 mg | ORAL_TABLET | Freq: Once | ORAL | Status: AC
  Administered 2024-02-16: 650 mg via ORAL
  Filled 2024-02-16: qty 2

## 2024-02-16 MED ORDER — SODIUM CHLORIDE 0.9 % IV SOLN
1000.0000 mg | Freq: Once | INTRAVENOUS | Status: AC
  Administered 2024-02-16: 1000 mg via INTRAVENOUS
  Filled 2024-02-16: qty 10

## 2024-02-16 MED ORDER — SODIUM CHLORIDE 0.9 % IV SOLN: INTRAVENOUS | Status: DC

## 2024-02-16 NOTE — Progress Notes (Signed)
 START OFF PATHWAY REGIMEN - Gastroesophageal   [id number]:Dostarlimab 500 mg IV D1 q21 Days (C1-4) Followed by Dostarlimab 1,000 mg IV D1 q42 Days (C5+):   Cycles 1 through 4: A cycle is every 21 days:     Dostarlimab-gxly    Cycles 5 and beyond: A cycle is every 42 days:     Dostarlimab-gxly   **Always confirm dose/schedule in your pharmacy ordering system**  Clinician Citation:   Patient Characteristics: Distant Metastases (cM1/pM1) / Locally Recurrent Disease, Adenocarcinoma - Esophageal, GE Junction, and Gastric, First Line, HER2 Negative/Unknown, PD?L1 Expression Positive CPS ? 5, Tislelizumab Preferred Disease Classification: GE Junction Histology: Adenocarcinoma Therapeutic Status: Distant Metastases (No Additional Staging) Line of Therapy: First Line HER2 Status: Negative PD-L1 Expression Status: PD-L1 Expression Positive CPS ? 5 Intent of Therapy: Curative Intent, Not Discussed with Patient

## 2024-02-16 NOTE — Progress Notes (Signed)
 Patient here for iron infusion and to see Dr Timmy for treatment plan discussion. He has previously treated GE Junction Cancer. Spoke with him in the infusion room. He does not want a social work or nutrition consult at this time. Scheduled for chemo education on 02/21/2024.  Oncology Nurse Navigator Documentation     02/16/2024    9:00 AM  Oncology Nurse Navigator Flowsheets  Navigator Follow Up Reason: Chemotherapy  Navigator Location [facility]  Navigator Encounter Type Treatment  Patient Visit Type MedOnc  Treatment Phase Active Tx  Barriers/Navigation Needs Coordination of Care;Education  Education Other  Interventions Coordination of Care;Education;Psycho-Social Support  Acuity Level 2-Minimal Needs (1-2 Barriers Identified)  Coordination of Care Appts  Education Method Verbal  Support Groups/Services Friends and Family  Time Spent with Patient 30

## 2024-02-16 NOTE — Progress Notes (Signed)
 " Hematology and Oncology Follow Up Visit  Connor Frazier [id number] [birth year] 69 y.o. 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 Connor Frazier is back for follow-up.  I was able to get some more information regarding his tumor.  Surprisingly, his tumor was MSI High and dMMR.  He did have an EGD.  This was done on 02/09/2024.  There was seen, although smaller mass at the GE junction.  This extended into the gastric cardia about 5 cm.  Biopsies were taken in the biopsy report showed a moderately differentiated adenocarcinoma.  He clearly had a response to immunotherapy.  I would think this would be the case given the fact that his tumor does have the microsatellite instability.  I know that he had a horrible time with combination immunotherapy.  However, I really think that we might be able to utilize single agent immunotherapy.  I think that utilizing dostarlimab would not be a bad idea.  I talked to he and his wife about this.  I know that they are a little bit uneasy about immunotherapy.  However, I told them that I really thought the issues with his hepatitis were from the Yervoy.  We would monitor his labs closely.  Of note, he does have iron deficiency.  This might be why he has not felt all that good.  His iron saturation was only 7%.  As such, we were able to give him some IV iron today.  Hopefully this will make him feel better.  Otherwise, he is managing fairly well.  I think he still on some low-dose prednisone.  We might be able to decrease the dose little bit more.  He has had no change in bowel or bladder habits.  He has had no cough or shortness of breath.  He has had no rashes.  There is been no bleeding.  Overall, I would say that his performance status is  probably ECOG 1.  Medications: Current Medications[1]  Allergies: Allergies[2]  Past Medical History, Surgical history, Social history, and Family History were reviewed and updated.  Review of Systems: Review of Systems  Constitutional:  Positive for fatigue.  HENT:  Negative.    Eyes: Negative.   Respiratory: Negative.    Cardiovascular: Negative.   Gastrointestinal: Negative.   Endocrine: Negative.   Genitourinary: Negative.    Musculoskeletal: Negative.   Skin: Negative.   Neurological: Negative.   Hematological: Negative.   Psychiatric/Behavioral: Negative.      Physical Exam:  height is 5' 10.5 (1.791 m) and weight is 177 lb (80.3 kg).   Wt Readings from Last 3 Encounters:  02/16/24 177 lb (80.3 kg)  02/07/24 179 lb (81.2 kg)  01/02/15 211 lb (95.7 kg)    Physical Exam Vitals reviewed.  HENT:     Head: Normocephalic and atraumatic.  Eyes:     Pupils: Pupils are equal, round, and reactive to light.  Cardiovascular:     Rate and Rhythm: Normal rate and regular rhythm.     Heart sounds: Normal heart sounds.  Pulmonary:     Effort: Pulmonary effort is normal.     Breath sounds: Normal breath sounds.  Abdominal:     General: Bowel sounds are normal.     Palpations: Abdomen is soft.  Musculoskeletal:  General: No tenderness or deformity. Normal range of motion.     Cervical back: Normal range of motion.  Lymphadenopathy:     Cervical: No cervical adenopathy.  Skin:    General: Skin is warm and dry.     Findings: No erythema or rash.  Neurological:     Mental Status: He is alert and oriented to person, place, and time.  Psychiatric:        Behavior: Behavior normal.        Thought Content: Thought content normal.        Judgment: Judgment normal.      Lab Results  Component Value Date   WBC 11.7 (H) 02/07/2024   HGB 13.4 02/07/2024   HCT 42.4 02/07/2024   MCV 86.4 02/07/2024   PLT 195 02/07/2024     Chemistry      Component Value  Date/Time   NA 139 02/07/2024 1109   K 4.4 02/07/2024 1109   CL 102 02/07/2024 1109   CO2 24 02/07/2024 1109   BUN 24 (H) 02/07/2024 1109   CREATININE 1.72 (H) 02/07/2024 1109   CREATININE 1.45 (H) 01/02/2015 1531      Component Value Date/Time   CALCIUM 9.8 02/07/2024 1109   ALKPHOS 46 02/07/2024 1109   AST 20 02/07/2024 1109   ALT 29 02/07/2024 1109   BILITOT 0.6 02/07/2024 1109      Impression and Plan: Mr. Connor Frazier is a very nice 69 year old white male.  He has a locally advanced adenocarcinoma of the GE junction.  He did not wish to have chemotherapy or radiation therapy.  Thankfully, his tumor does have the microsatellite instability.  As such, I do think that immunotherapy would be reasonable to try, again.  We will see about using dostarlimab.  We have had good success with this.  Patients have tolerated this well.  Again we will have to watch his labs closely.  We probably would have to check his labs weekly after he gets the dostarlimab to make sure that nothing has really increased.  Hopefully, the iron that we are giving him will help.  I told him that it may take a couple weeks before the iron really is mobilized.  Again, we will see if he can respond and tolerate the dostarlimab.  I do think that immunotherapy is the way to try to treat this.  Hopefully, with single immunotherapy, we will be able to get a response without toxicity.  I will plan to get him back when we start his second cycle of dostarlimab.  After his fourth cycle, we probably would then repeat a PET scan and probably have another EGD done.   Maude JONELLE Crease, MD 1/15/202611:59 AM      [1]  Current Outpatient Medications:    chlorhexidine  (PERIDEX ) 0.12 % solution, SMARTSIG:0.5 Capful(s) Every Night, Disp: , Rfl:    NP THYROID 60 MG tablet, Take 60 mg by mouth every morning., Disp: , Rfl:    omeprazole (PRILOSEC) 20 MG capsule, Take 20 mg by mouth daily., Disp: , Rfl:    predniSONE  (DELTASONE) 10 MG tablet, Take 10 mg by mouth daily with breakfast., Disp: , Rfl:    Probiotic Product (PROBIOTIC DAILY PO), Take 1 tablet by mouth daily., Disp: , Rfl:  No current facility-administered medications for this visit.  Facility-Administered Medications Ordered in Other Visits:    0.9 %  sodium chloride  infusion, , Intravenous, Continuous, Cristina Ceniceros, Maude JONELLE, MD, Stopped at 02/16/24 1056 [2]  Allergies  Allergen Reactions   Other Nausea And Vomiting    ANAESTHESIA MAKES PT VERY SICK ON STOMACH   Penicillins Nausea And Vomiting, Other (See Comments) and Nausea Only    Reaction only if taken po, can tolerate IV  Upset stomach  Reaction only if taken po, can tolerate IV  Reaction only if taken po, can tolerate IV   "

## 2024-02-17 ENCOUNTER — Encounter: Payer: Self-pay | Admitting: Hematology & Oncology

## 2024-02-17 ENCOUNTER — Other Ambulatory Visit: Payer: Self-pay

## 2024-02-17 NOTE — Progress Notes (Signed)
 Pharmacist Chemotherapy Monitoring - Initial Assessment    Anticipated start date: 02/24/2024   The following has been reviewed per standard work regarding the patient's treatment regimen: The patient's diagnosis, treatment plan and drug doses, and organ/hematologic function Lab orders and baseline tests specific to treatment regimen  The treatment plan start date, drug sequencing, and pre-medications Prior authorization status  Patient's documented medication list, including drug-drug interaction screen and prescriptions for anti-emetics and supportive care specific to the treatment regimen The drug concentrations, fluid compatibility, administration routes, and timing of the medications to be used The patient's access for treatment and lifetime cumulative dose history, if applicable  The patient's medication allergies and previous infusion related reactions, if applicable   Changes made to treatment plan:  N/A  Follow up needed:  N/A   Norleen JAYSON Sou, [facility], 02/17/2024  2:47 PM

## 2024-02-18 ENCOUNTER — Other Ambulatory Visit: Payer: Self-pay

## 2024-02-20 ENCOUNTER — Telehealth: Payer: Self-pay | Admitting: *Deleted

## 2024-02-20 NOTE — Telephone Encounter (Signed)
 Reviewed with MD  recent PET scan 01/14/24,  will pt need additional scan prior to starting treatment. MD, advised  ok to continue with treatment with PET scan from 01/14/24 Called and discussed with pt above information. Pt verbalized understanding. No concerns at this time.

## 2024-02-21 ENCOUNTER — Encounter: Payer: Self-pay | Admitting: *Deleted

## 2024-02-21 ENCOUNTER — Other Ambulatory Visit: Payer: Self-pay | Admitting: *Deleted

## 2024-02-21 ENCOUNTER — Inpatient Hospital Stay

## 2024-02-21 DIAGNOSIS — C155 Malignant neoplasm of lower third of esophagus: Secondary | ICD-10-CM

## 2024-02-21 MED ORDER — PROCHLORPERAZINE MALEATE 10 MG PO TABS: 10.0000 mg | ORAL_TABLET | Freq: Four times a day (QID) | ORAL | 1 refills | Status: AC | PRN

## 2024-02-21 MED ORDER — ONDANSETRON HCL 8 MG PO TABS: 8.0000 mg | ORAL_TABLET | Freq: Three times a day (TID) | ORAL | 1 refills | Status: DC | PRN

## 2024-02-21 MED ORDER — ONDANSETRON HCL 8 MG PO TABS: 8.0000 mg | ORAL_TABLET | Freq: Three times a day (TID) | ORAL | 1 refills | Status: AC | PRN

## 2024-02-21 MED ORDER — PROCHLORPERAZINE MALEATE 10 MG PO TABS: 10.0000 mg | ORAL_TABLET | Freq: Four times a day (QID) | ORAL | 1 refills | Status: DC | PRN

## 2024-02-21 NOTE — Progress Notes (Unsigned)
 Patient in chemotherapy/immunotherapy education today by himself to discuss side effects of Jemperli which include but are not limited to myelosuppression, decreased appetite, fatigue, fever, allergic or infusional reaction, cough, SOB, nausea and vomiting, diarrhea, dark or tarry stools, abdominal pain, yellowing of the skin, dark urine, elevated LFTs, myalgia and arthralgias, rash,  mental changes (confusion, changes in mood) , increased risk of infections, weight loss.  Reviewed infusion room and office policy and procedure and phone numbers 24 hours x 7 days a week.  .  Reviewed when to call the office with any concerns or problems.  Transport planner given.   Antiemetic protocol and chemotherapy/immunotherapy schedule reviewed. Patient verbalized understanding of chemotherapy/immunotherapy indications and possible side effects.  Teachback done

## 2024-02-22 ENCOUNTER — Other Ambulatory Visit: Payer: Self-pay

## 2024-02-24 ENCOUNTER — Inpatient Hospital Stay

## 2024-02-24 ENCOUNTER — Encounter: Payer: Self-pay | Admitting: *Deleted

## 2024-02-24 ENCOUNTER — Other Ambulatory Visit: Payer: Self-pay

## 2024-02-24 VITALS — BP 123/60 | HR 63 | Temp 97.7°F | Resp 18

## 2024-02-24 DIAGNOSIS — C155 Malignant neoplasm of lower third of esophagus: Secondary | ICD-10-CM

## 2024-02-24 DIAGNOSIS — C160 Malignant neoplasm of cardia: Secondary | ICD-10-CM | POA: Diagnosis not present

## 2024-02-24 LAB — CBC WITH DIFFERENTIAL (CANCER CENTER ONLY)
Abs Immature Granulocytes: 0.09 K/uL — ABNORMAL HIGH (ref 0.00–0.07)
Basophils Absolute: 0.1 K/uL (ref 0.0–0.1)
Basophils Relative: 1 %
Eosinophils Absolute: 0.3 K/uL (ref 0.0–0.5)
Eosinophils Relative: 3 %
HCT: 42.6 % (ref 39.0–52.0)
Hemoglobin: 13.5 g/dL (ref 13.0–17.0)
Immature Granulocytes: 1 %
Lymphocytes Relative: 19 %
Lymphs Abs: 1.7 K/uL (ref 0.7–4.0)
MCH: 27.6 pg (ref 26.0–34.0)
MCHC: 31.7 g/dL (ref 30.0–36.0)
MCV: 86.9 fL (ref 80.0–100.0)
Monocytes Absolute: 1.3 K/uL — ABNORMAL HIGH (ref 0.1–1.0)
Monocytes Relative: 15 %
Neutro Abs: 5.4 K/uL (ref 1.7–7.7)
Neutrophils Relative %: 61 %
Platelet Count: 157 K/uL (ref 150–400)
RBC: 4.9 MIL/uL (ref 4.22–5.81)
RDW: 15.9 % — ABNORMAL HIGH (ref 11.5–15.5)
WBC Count: 8.9 K/uL (ref 4.0–10.5)
nRBC: 0 % (ref 0.0–0.2)

## 2024-02-24 LAB — CMP (CANCER CENTER ONLY)
ALT: 138 U/L — ABNORMAL HIGH (ref 0–44)
AST: 60 U/L — ABNORMAL HIGH (ref 15–41)
Albumin: 3.7 g/dL (ref 3.5–5.0)
Alkaline Phosphatase: 51 U/L (ref 38–126)
Anion gap: 11 (ref 5–15)
BUN: 24 mg/dL — ABNORMAL HIGH (ref 8–23)
CO2: 23 mmol/L (ref 22–32)
Calcium: 9.4 mg/dL (ref 8.9–10.3)
Chloride: 104 mmol/L (ref 98–111)
Creatinine: 1.58 mg/dL — ABNORMAL HIGH (ref 0.61–1.24)
GFR, Estimated: 47 mL/min — ABNORMAL LOW
Glucose, Bld: 76 mg/dL (ref 70–99)
Potassium: 4.3 mmol/L (ref 3.5–5.1)
Sodium: 138 mmol/L (ref 135–145)
Total Bilirubin: 0.7 mg/dL (ref 0.0–1.2)
Total Protein: 7 g/dL (ref 6.5–8.1)

## 2024-02-24 LAB — T4, FREE: Free T4: 1.09 ng/dL (ref 0.80–2.00)

## 2024-02-24 LAB — TSH: TSH: 0.462 u[IU]/mL (ref 0.350–4.500)

## 2024-02-24 MED ORDER — SODIUM CHLORIDE 0.9 % IV SOLN: INTRAVENOUS | Status: DC

## 2024-02-24 MED ORDER — SODIUM CHLORIDE 0.9% FLUSH: 10.0000 mL | INTRAVENOUS | Status: DC | PRN

## 2024-02-24 MED ORDER — ALTEPLASE 2 MG IJ SOLR: 2.0000 mg | Freq: Once | INTRAMUSCULAR | Status: DC | PRN

## 2024-02-24 MED ORDER — PREDNISONE 10 MG PO TABS: 10.0000 mg | ORAL_TABLET | Freq: Every day | ORAL | 0 refills | Status: AC

## 2024-02-24 MED ORDER — SODIUM CHLORIDE 0.9% FLUSH: 3.0000 mL | INTRAVENOUS | Status: DC | PRN

## 2024-02-24 MED ORDER — HEPARIN SOD (PORK) LOCK FLUSH 100 UNIT/ML IV SOLN: 500.0000 [IU] | Freq: Once | INTRAVENOUS | Status: DC | PRN

## 2024-02-24 MED ORDER — HEPARIN SOD (PORK) LOCK FLUSH 100 UNIT/ML IV SOLN: 250.0000 [IU] | Freq: Once | INTRAVENOUS | Status: DC | PRN

## 2024-02-24 MED ORDER — SODIUM CHLORIDE 0.9 % IV SOLN: 500.0000 mg | Freq: Once | INTRAVENOUS | Status: DC

## 2024-02-24 NOTE — Progress Notes (Signed)
 Patient comes in today to start his new immunotherapy. He has chemo education earlier this week and has no questions at this time. He is encouraged to call the office, or on-call service with any questions or concerns. He is scheduled for his weekly labs to monitor liver function.   CMP results show increased liver enzymes. Treatment start will be delayed by two weeks.   Oncology Nurse Navigator Documentation     02/24/2024    9:45 AM  Oncology Nurse Navigator Flowsheets  Navigator Follow Up Date: 03/09/2024  Navigator Follow Up Reason: Chemotherapy  Navigator Location [facility]  Navigator Encounter Type Treatment  Patient Visit Type MedOnc  Treatment Phase Active Tx  Barriers/Navigation Needs Coordination of Care;Education  Education Other  Interventions Education;Psycho-Social Support  Acuity Level 2-Minimal Needs (1-2 Barriers Identified)  Education Method Verbal  Support Groups/Services Friends and Family  Time Spent with Patient 15

## 2024-02-24 NOTE — Progress Notes (Signed)
 Patient will not be treated today d/t his ALT value per Dr. Timmy. Will return in two weeks.

## 2024-02-25 ENCOUNTER — Other Ambulatory Visit: Payer: Self-pay

## 2024-02-27 ENCOUNTER — Other Ambulatory Visit: Payer: Self-pay

## 2024-03-02 ENCOUNTER — Inpatient Hospital Stay

## 2024-03-09 ENCOUNTER — Encounter: Payer: Self-pay | Admitting: *Deleted

## 2024-03-09 ENCOUNTER — Inpatient Hospital Stay

## 2024-03-09 ENCOUNTER — Telehealth: Payer: Self-pay

## 2024-03-09 ENCOUNTER — Encounter: Payer: Self-pay | Admitting: Hematology & Oncology

## 2024-03-09 VITALS — BP 121/64 | HR 57 | Temp 97.5°F | Resp 18 | Wt 182.0 lb

## 2024-03-09 DIAGNOSIS — C155 Malignant neoplasm of lower third of esophagus: Secondary | ICD-10-CM

## 2024-03-09 LAB — CMP (CANCER CENTER ONLY)
ALT: 30 U/L (ref 0–44)
AST: 26 U/L (ref 15–41)
Albumin: 4.2 g/dL (ref 3.5–5.0)
Alkaline Phosphatase: 47 U/L (ref 38–126)
Anion gap: 14 (ref 5–15)
BUN: 27 mg/dL — ABNORMAL HIGH (ref 8–23)
CO2: 24 mmol/L (ref 22–32)
Calcium: 10.3 mg/dL (ref 8.9–10.3)
Chloride: 104 mmol/L (ref 98–111)
Creatinine: 1.83 mg/dL — ABNORMAL HIGH (ref 0.61–1.24)
GFR, Estimated: 40 mL/min — ABNORMAL LOW
Glucose, Bld: 86 mg/dL (ref 70–99)
Potassium: 4.7 mmol/L (ref 3.5–5.1)
Sodium: 142 mmol/L (ref 135–145)
Total Bilirubin: 0.5 mg/dL (ref 0.0–1.2)
Total Protein: 7.5 g/dL (ref 6.5–8.1)

## 2024-03-09 LAB — CBC WITH DIFFERENTIAL (CANCER CENTER ONLY)
Abs Immature Granulocytes: 0.11 10*3/uL — ABNORMAL HIGH (ref 0.00–0.07)
Basophils Absolute: 0.1 10*3/uL (ref 0.0–0.1)
Basophils Relative: 0 %
Eosinophils Absolute: 0.2 10*3/uL (ref 0.0–0.5)
Eosinophils Relative: 1 %
HCT: 45.7 % (ref 39.0–52.0)
Hemoglobin: 14.3 g/dL (ref 13.0–17.0)
Immature Granulocytes: 1 %
Lymphocytes Relative: 23 %
Lymphs Abs: 2.7 10*3/uL (ref 0.7–4.0)
MCH: 27.8 pg (ref 26.0–34.0)
MCHC: 31.3 g/dL (ref 30.0–36.0)
MCV: 88.7 fL (ref 80.0–100.0)
Monocytes Absolute: 1.2 10*3/uL — ABNORMAL HIGH (ref 0.1–1.0)
Monocytes Relative: 10 %
Neutro Abs: 7.5 10*3/uL (ref 1.7–7.7)
Neutrophils Relative %: 65 %
Platelet Count: 163 10*3/uL (ref 150–400)
RBC: 5.15 MIL/uL (ref 4.22–5.81)
RDW: 16.6 % — ABNORMAL HIGH (ref 11.5–15.5)
WBC Count: 11.7 10*3/uL — ABNORMAL HIGH (ref 4.0–10.5)
nRBC: 0 % (ref 0.0–0.2)

## 2024-03-09 LAB — T4, FREE: Free T4: 1.03 ng/dL (ref 0.80–2.00)

## 2024-03-09 LAB — TSH: TSH: 1.28 u[IU]/mL (ref 0.350–4.500)

## 2024-03-09 MED ORDER — SODIUM CHLORIDE 0.9 % IV SOLN
500.0000 mg | Freq: Once | INTRAVENOUS | Status: AC
  Administered 2024-03-09: 500 mg via INTRAVENOUS
  Filled 2024-03-09: qty 10

## 2024-03-09 MED ORDER — SODIUM CHLORIDE 0.9 % IV SOLN: INTRAVENOUS | Status: DC

## 2024-03-09 NOTE — Progress Notes (Signed)
Per Dr. Marin Olp, okay to treat today, despite labs

## 2024-03-09 NOTE — Progress Notes (Signed)
 Patient will start treatment today after a two week delay due to increased liver enzymes. He will also need weekly labs between cycles to monitor for liver function. Lab scheduled for next Friday.   There was question about a PET scheduled for later this month. Patient has already had a baseline PET at outside facility in December 2025. We do not need another PET until after cycle four. Clarified with Dr Timmy. Appointment cancelled and order dc'd. Message sent to patient.   Oncology Nurse Navigator Documentation     03/09/2024   11:30 AM  Oncology Nurse Navigator Flowsheets  Navigator Follow Up Date: 03/30/2024  Navigator Follow Up Reason: Follow-up Appointment;Chemotherapy  Navigator Location [facility]  Navigator Encounter Type Treatment  Patient Visit Type MedOnc  Treatment Phase Active Tx  Barriers/Navigation Needs Coordination of Care;Education  Interventions Coordination of Care;Psycho-Social Support  Acuity Level 2-Minimal Needs (1-2 Barriers Identified)  Coordination of Care Appts  Support Groups/Services Friends and Family  Time Spent with Patient 30

## 2024-03-09 NOTE — Telephone Encounter (Signed)
 Pt inquiring about steroid use and if he needs to continue. Per Dr Timmy ok to cut prednisone  from 10 mg to 5 mg x 1 week then stop. Pt advised and states understanding.

## 2024-03-09 NOTE — Patient Instructions (Signed)
 [facility] - A DEPT OF [facility]  Discharge Instructions: Thank you for choosing [facility] to provide your oncology and hematology care.   If you have a lab appointment with the Cancer Center, please go directly to the Cancer Center and check in at the registration area.  Wear comfortable clothing and clothing appropriate for easy access to any Portacath or PICC line.   We strive to give you quality time with your provider. You may need to reschedule your appointment if you arrive late (15 or more minutes).  Arriving late affects you and other patients whose appointments are after yours.  Also, if you miss three or more appointments without notifying the office, you may be dismissed from the clinic at the providers discretion.      For prescription refill requests, have your pharmacy contact our office and allow 72 hours for refills to be completed.    Today you received the following chemotherapy and/or immunotherapy agents:  Jemperli       To help prevent nausea and vomiting after your treatment, we encourage you to take your nausea medication as directed.  BELOW ARE SYMPTOMS THAT SHOULD BE REPORTED IMMEDIATELY: *FEVER GREATER THAN 100.4 F (38 C) OR HIGHER *CHILLS OR SWEATING *NAUSEA AND VOMITING THAT IS NOT CONTROLLED WITH YOUR NAUSEA MEDICATION *UNUSUAL SHORTNESS OF BREATH *UNUSUAL BRUISING OR BLEEDING *URINARY PROBLEMS (pain or burning when urinating, or frequent urination) *BOWEL PROBLEMS (unusual diarrhea, constipation, pain near the anus) TENDERNESS IN MOUTH AND THROAT WITH OR WITHOUT PRESENCE OF ULCERS (sore throat, sores in mouth, or a toothache) UNUSUAL RASH, SWELLING OR PAIN  UNUSUAL VAGINAL DISCHARGE OR ITCHING   Items with * indicate a potential emergency and should be followed up as soon as possible or go to the Emergency Department if any problems should occur.  Please show the CHEMOTHERAPY ALERT CARD or IMMUNOTHERAPY  ALERT CARD at check-in to the Emergency Department and triage nurse. Should you have questions after your visit or need to cancel or reschedule your appointment, please contact [facility] - A DEPT OF [facility]  [phone] and follow the prompts.  Office hours are 8:00 a.m. to 4:30 p.m. Monday - Friday. Please note that voicemails left after 4:00 p.m. may not be returned until the following business day.  We are closed weekends and major holidays. You have access to a nurse at all times for urgent questions. Please call the main number to the clinic [phone] and follow the prompts.  For any non-urgent questions, you may also contact your provider using MyChart. We now offer e-Visits for anyone 41 and older to request care online for non-urgent symptoms. For details visit [email or web address].   Also download the MyChart app! Go to the app store, search MyChart, open the app, select [place], and log in with your MyChart username and password.

## 2024-03-15 ENCOUNTER — Inpatient Hospital Stay

## 2024-03-15 ENCOUNTER — Inpatient Hospital Stay: Admitting: Hematology & Oncology

## 2024-03-16 ENCOUNTER — Inpatient Hospital Stay

## 2024-03-23 ENCOUNTER — Inpatient Hospital Stay

## 2024-03-26 ENCOUNTER — Encounter (HOSPITAL_COMMUNITY)

## 2024-03-30 ENCOUNTER — Inpatient Hospital Stay

## 2024-03-30 ENCOUNTER — Inpatient Hospital Stay: Admitting: Hematology & Oncology

## 2024-04-06 ENCOUNTER — Inpatient Hospital Stay

## 2024-04-13 ENCOUNTER — Inpatient Hospital Stay: Admitting: Hematology & Oncology

## 2024-04-13 ENCOUNTER — Inpatient Hospital Stay

## 2024-04-20 ENCOUNTER — Inpatient Hospital Stay

## 2024-04-20 ENCOUNTER — Inpatient Hospital Stay: Admitting: Hematology & Oncology
